# Patient Record
Sex: Male | Born: 2000 | Race: White | Marital: Single | Smoking: Never smoker
Health system: Southern US, Community
[De-identification: ages and names within clinical notes are randomized; demographics above are authoritative.]

---
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
## 2021-04-02 NOTE — Progress Notes (Signed)
[facility] [address] [place] [address] Phone: [phone] Subjective:   Bruce Donath, am serving as a scribe for Dr. Antoine Primas.  This visit occurred during the SARS-CoV-2 public health emergency.  Safety protocols were in place, including screening questions prior to the visit, additional usage of staff PPE, and extensive cleaning of exam room while observing appropriate contact time as indicated for disinfecting solutions.   I'm seeing this patient by the request  of:  No primary care provider on file.  CC: Right shoulder pain  [id number]  Tevion Laforge is a 20 y.o. male coming in with complaint of right shoulder pain. Patient states that pain began at 20 yo when he swam. Pain occurring over superior aspect. Pain with bench press and OH press. Patient modifies activity or rests. Patient like ji jitsu. Denies any radiating symptoms.       No past medical history on file. No past surgical history on file. Social History   Socioeconomic History   Marital status: Single    Spouse name: Not on file   Number of children: Not on file   Years of education: Not on file   Highest education level: Not on file  Occupational History   Not on file  Tobacco Use   Smoking status: Never   Smokeless tobacco: Never  Substance and Sexual Activity   Alcohol use: Not on file   Drug use: Not on file   Sexual activity: Not on file  Other Topics Concern   Not on file  Social History Narrative   Not on file   Social Determinants of Health   Financial Resource Strain: Not on file  Food Insecurity: Not on file  Transportation Needs: Not on file  Physical Activity: Not on file  Stress: Not on file  Social Connections: Not on file   Not on File No family history on file.     Current Outpatient Medications (Analgesics):    meloxicam (MOBIC) 7.5 MG tablet, Take 1 tablet (7.5 mg total) by mouth daily.    Reviewed prior external  information including notes and imaging from  primary care provider As well as notes that were available from care everywhere and other healthcare systems.  Past medical history, social, surgical and family history all reviewed in electronic medical record.  No pertanent information unless stated regarding to the chief complaint.   Review of Systems:  No headache, visual changes, nausea, vomiting, diarrhea, constipation, dizziness, abdominal pain, skin rash, fevers, chills, night sweats, weight loss, swollen lymph nodes, body aches, joint swelling, chest pain, shortness of breath, mood changes. POSITIVE muscle aches  Objective  Blood pressure 114/74, pulse 73, height 6' (1.829 m), weight 169 lb (76.7 kg), SpO2 97 %.   General: No apparent distress alert and oriented x3 mood and affect normal, dressed appropriately.  HEENT: Pupils equal, extraocular movements intact  Respiratory: Patient's speak in full sentences and does not appear short of breath  Cardiovascular: No lower extremity edema, non tender, no erythema  Gait normal with good balance and coordination.  MSK: Right shoulder exam shows the patient does have mild impingement noted with Hawkins.  Patient does have full range of motion otherwise noted.  Patient does have 5-5 strength of the rotator cuff.   Limited muscular skeletal ultrasound was performed and interpreted by Antoine Primas, M  Limited shoulder views show the patient does have some mild hypoechoic changes of the tendon which is consistent with mild tendinitis.  No true tearing appreciated.  No underlying arthritic changes noted. Impression: Mild tendinitis  [id number]; 15 additional minutes spent for Therapeutic exercises as stated in above notes.  This included exercises focusing on stretching, strengthening, with significant focus on eccentric aspects.   Long term goals include an improvement in range of motion, strength, endurance as well as avoiding reinjury. Patient's  frequency would include in 1-2 times a day, 3-5 times a week for a duration of 6-12 weeks. Shoulder Exercises that included:  Basic scapular stabilization to include adduction and depression of scapula Scaption, focusing on proper movement and good control Internal and External rotation utilizing a theraband, with elbow tucked at side entire time Rows with theraband    Proper technique shown and discussed handout in great detail with ATC.  All questions were discussed and answered.      Impression and Recommendations:     The above documentation has been reviewed and is accurate and complete Judi Saa, DO

## 2021-04-05 ENCOUNTER — Ambulatory Visit (INDEPENDENT_AMBULATORY_CARE_PROVIDER_SITE_OTHER): Payer: 59

## 2021-04-05 ENCOUNTER — Ambulatory Visit: Payer: Self-pay

## 2021-04-05 ENCOUNTER — Other Ambulatory Visit: Payer: Self-pay

## 2021-04-05 ENCOUNTER — Encounter: Payer: Self-pay | Admitting: Family Medicine

## 2021-04-05 ENCOUNTER — Ambulatory Visit (INDEPENDENT_AMBULATORY_CARE_PROVIDER_SITE_OTHER): Payer: 59 | Admitting: Family Medicine

## 2021-04-05 VITALS — BP 114/74 | HR 73 | Ht 72.0 in | Wt 169.0 lb

## 2021-04-05 DIAGNOSIS — M25511 Pain in right shoulder: Secondary | ICD-10-CM

## 2021-04-05 DIAGNOSIS — M7541 Impingement syndrome of right shoulder: Secondary | ICD-10-CM | POA: Diagnosis not present

## 2021-04-05 MED ORDER — MELOXICAM 7.5 MG PO TABS: 7.5000 mg | ORAL_TABLET | Freq: Every day | ORAL | 0 refills | Status: DC

## 2021-04-05 NOTE — Patient Instructions (Signed)
Scapular exercises Meloxicam 7.5mg  Keep hands in peripheral vision See me in 6 weeks

## 2021-04-05 NOTE — Assessment & Plan Note (Signed)
Patient seems to be having more shoulder impingement.  Seems to be secondary to potential posture and ergonomics.  Discussed icing regimen and home exercises.  We discussed with patient about avoiding certain positioning throughout the day that I think is also potentially contributing.  Discussed icing regimen and home exercises.  Increase activity slowly.  Follow-up with me again 6 to 8 weeks.

## 2021-05-13 NOTE — Progress Notes (Signed)
[facility] [address] [place] [address] Phone: [phone] Subjective:   Stuart Hardin, am serving as a scribe for Dr. Antoine Primas. This visit occurred during the SARS-CoV-2 public health emergency.  Safety protocols were in place, including screening questions prior to the visit, additional usage of staff PPE, and extensive cleaning of exam room while observing appropriate contact time as indicated for disinfecting solutions.   I'm seeing this patient by the request  of:  Pcp, No  CC: shoulder pain follow up   [id number]  04/05/2021 Patient seems to be having more shoulder impingement.  Seems to be secondary to potential posture and ergonomics.  Discussed icing regimen and home exercises.  We discussed with patient about avoiding certain positioning throughout the day that I think is also potentially contributing.  Discussed icing regimen and home exercises.  Increase activity slowly.  Follow-up with me again 6 to 8 weeks.  Updated 05/17/2021 Stuart Hardin is a 20 y.o. male coming in with complaint of right shoulder pain. Noticing more pain than last visit with IR, ER and flexion. No new injury. Patient making modifications in weight room. No numbness or tingling.  Patient states that unfortunately continues to have difficulty with that he does not think he is making improvement and may even notice a little bit decrease in range of motion.  Xray unremarkable     No past medical history on file. No past surgical history on file. Social History   Socioeconomic History   Marital status: Single    Spouse name: Not on file   Number of children: Not on file   Years of education: Not on file   Highest education level: Not on file  Occupational History   Not on file  Tobacco Use   Smoking status: Never   Smokeless tobacco: Never  Substance and Sexual Activity   Alcohol use: Not on file   Drug use: Not on file   Sexual activity: Not on  file  Other Topics Concern   Not on file  Social History Narrative   Not on file   Social Determinants of Health   Financial Resource Strain: Not on file  Food Insecurity: Not on file  Transportation Needs: Not on file  Physical Activity: Not on file  Stress: Not on file  Social Connections: Not on file   Not on File No family history on file.     Current Outpatient Medications (Analgesics):    meloxicam (MOBIC) 7.5 MG tablet, Take 1 tablet (7.5 mg total) by mouth daily.    Reviewed prior external information including notes and imaging from  primary care provider As well as notes that were available from care everywhere and other healthcare systems.  Past medical history, social, surgical and family history all reviewed in electronic medical record.  No pertanent information unless stated regarding to the chief complaint.   Review of Systems:  No headache, visual changes, nausea, vomiting, diarrhea, constipation, dizziness, abdominal pain, skin rash, fevers, chills, night sweats, weight loss, swollen lymph nodes, body aches, joint swelling, chest pain, shortness of breath, mood changes. POSITIVE muscle aches  Objective  Blood pressure 112/64, pulse (!) 59, height 6' (1.829 m), weight 167 lb (75.8 kg), SpO2 97 %.   General: No apparent distress alert and oriented x3 mood and affect normal, dressed appropriately.  HEENT: Pupils equal, extraocular movements intact  Respiratory: Patient's speak in full sentences and does not appear short of breath  Cardiovascular: No  lower extremity edema, non tender, no erythema  Gait normal with good balance and coordination.  MSK:  shoulder exam shows patient does have impingement noted.  Patient does have limited internal and external range of motion lacking last 5 degrees.  Positive O'Brien's noted.  Negative crossover.  Limited muscular skeletal ultrasound was performed and interpreted by Antoine Primas, M  Limited musculoskeletal  ultrasound shows the patient does have an increase in hypoechoic changes of the glenohumeral joint on the posterior aspect from previous exam.  Patient also has what appears to be a potential labral tear noted.  Difficult to assess completely.  Patient also has history of the hypoechoic changes of the supraspinatus that was seen previously no acute tear appreciated.   Impression and Recommendations:    The above documentation has been reviewed and is accurate and complete Judi Saa, DO

## 2021-05-17 ENCOUNTER — Encounter: Payer: Self-pay | Admitting: Family Medicine

## 2021-05-17 ENCOUNTER — Ambulatory Visit: Payer: Self-pay

## 2021-05-17 ENCOUNTER — Ambulatory Visit (INDEPENDENT_AMBULATORY_CARE_PROVIDER_SITE_OTHER): Payer: 59 | Admitting: Family Medicine

## 2021-05-17 ENCOUNTER — Other Ambulatory Visit: Payer: Self-pay

## 2021-05-17 VITALS — BP 112/64 | HR 59 | Ht 72.0 in | Wt 167.0 lb

## 2021-05-17 DIAGNOSIS — M25511 Pain in right shoulder: Secondary | ICD-10-CM | POA: Diagnosis not present

## 2021-05-17 DIAGNOSIS — M7541 Impingement syndrome of right shoulder: Secondary | ICD-10-CM | POA: Diagnosis not present

## 2021-05-17 NOTE — Assessment & Plan Note (Signed)
Patient continues to have impingement but now is having more of an O'Brien sign.  Patient has failed conservative therapy including home exercises, physical therapy, oral anti-inflammatories and is now having some limited range of motion.  Concerned that patient also could have a possible labral pathology so MR arthrogram ordered today.  Depending on findings we will discuss different treatment options including injections or the possibility of surgical intervention if necessary.

## 2021-05-17 NOTE — Patient Instructions (Addendum)
MRA R shoulder [phone] We will contact you with results

## 2021-06-11 ENCOUNTER — Ambulatory Visit
Admission: RE | Admit: 2021-06-11 | Discharge: 2021-06-11 | Disposition: A | Discharge status: Home / Self Care | Payer: 59 | Source: Ambulatory Visit | Attending: Family Medicine | Admitting: Family Medicine

## 2021-06-11 DIAGNOSIS — M25511 Pain in right shoulder: Secondary | ICD-10-CM

## 2021-06-11 MED ORDER — IOPAMIDOL (ISOVUE-M 200) INJECTION 41%
18.0000 mL | Freq: Once | INTRAMUSCULAR | Status: AC
  Administered 2021-06-11: 18 mL via INTRA_ARTICULAR

## 2021-06-16 NOTE — Progress Notes (Signed)
  [facility] [address] [place] [address] Phone: [phone] Subjective:   [name], am serving as a scribe for Dr. Antoine Primas. This visit occurred during the SARS-CoV-2 public health emergency.  Safety protocols were in place, including screening questions prior to the visit, additional usage of staff PPE, and extensive cleaning of exam room while observing appropriate contact time as indicated for disinfecting solutions.   I'm seeing this patient by the request  of:  Pcp, No  CC: shoulder pain   [id number]  05/17/2021 Patient continues to have impingement but now is having more of an O'Brien sign.  Patient has failed conservative therapy including home exercises, physical therapy, oral anti-inflammatories and is now having some limited range of motion.  Concerned that patient also could have a possible labral pathology so MR arthrogram ordered today.  Depending on findings we will discuss different treatment options including injections or the possibility of surgical intervention if necessary.  Updated 06/17/2021 Stuart Hardin is a 20 y.o. male coming in with complaint of shoulder pain. Pain remains the same. May have gotten a little better. PRP possible.  Mri IMPRESSION: 1. 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 2. No discrete labral tear. Tiny tear at the posterior labroligamentous junction.       No past medical history on file. No past surgical history on file. Social History   Socioeconomic History   Marital status: Single    Spouse name: Not on file   Number of children: Not on file   Years of education: Not on file   Highest education level: Not on file  Occupational History   Not on file  Tobacco Use   Smoking status: Never   Smokeless tobacco: Never  Substance and Sexual Activity   Alcohol use: Not on file   Drug use: Not on file   Sexual activity: Not on file  Other Topics  Concern   Not on file  Social History Narrative   Not on file   Social Determinants of Health   Financial Resource Strain: Not on file  Food Insecurity: Not on file  Transportation Needs: Not on file  Physical Activity: Not on file  Stress: Not on file  Social Connections: Not on file   Not on File No family history on file.     Current Outpatient Medications (Analgesics):    meloxicam (MOBIC) 7.5 MG tablet, Take 1 tablet (7.5 mg total) by mouth daily.     Review of Systems:  No headache, visual changes, nausea, vomiting, diarrhea, constipation, dizziness, abdominal pain, skin rash, fevers, chills, night sweats, weight loss, swollen lymph nodes, body aches, joint swelling, chest pain, shortness of breath, mood changes. POSITIVE muscle aches  Objective  Blood pressure 118/64, height 6' (1.829 m), weight 170 lb (77.1 kg).   General: No apparent distress alert and oriented x3 mood and affect normal, dressed appropriately.  HEENT: Pupils equal, extraocular movements intact  Respiratory: Patient's speak in full sentences and does not appear short of breath  Cardiovascular: No lower extremity edema, non tender, no erythema  Gait normal with good balance and coordination.  MSK:  right shoulder mild impingement mild positive O'Brien's.  5-5 strength of the rotator cuff noted    Impression and Recommendations:    The above documentation has been reviewed and is accurate and complete Judi Saa, DO

## 2021-06-17 ENCOUNTER — Other Ambulatory Visit: Payer: Self-pay

## 2021-06-17 ENCOUNTER — Ambulatory Visit (INDEPENDENT_AMBULATORY_CARE_PROVIDER_SITE_OTHER): Payer: 59 | Admitting: Family Medicine

## 2021-06-17 VITALS — BP 118/64 | Ht 72.0 in | Wt 170.0 lb

## 2021-06-17 DIAGNOSIS — M7541 Impingement syndrome of right shoulder: Secondary | ICD-10-CM | POA: Diagnosis not present

## 2021-06-17 DIAGNOSIS — S46811A Strain of other muscles, fascia and tendons at shoulder and upper arm level, right arm, initial encounter: Secondary | ICD-10-CM | POA: Diagnosis not present

## 2021-06-17 NOTE — Assessment & Plan Note (Addendum)
Patient does have a small interstitial tearing of the infraspinatus noted.  No significant weakness at this time.  He does have some very mild labral pathology as well on the MRI.  Patient is making progress and states that does not have as much significant discomfort as he did previously.  Do feel that formal physical therapy could be beneficial.  Depending on how patient responds we will see patient again in 6 to 8 weeks to discuss further.  Worsening symptoms could consider the possibility of injection.  Total time to speak discussed with patient as well as reviewing previous notes, previous ultrasounds and patient's MRI 31 minutes

## 2021-06-17 NOTE — Patient Instructions (Addendum)
PT referral  You're going to do great Keep being aware of positioning See you again in 2 months

## 2021-07-01 ENCOUNTER — Ambulatory Visit
Admission: EM | Admit: 2021-07-01 | Discharge: 2021-07-01 | Disposition: A | Discharge status: Home / Self Care | Payer: 59 | Attending: Nurse Practitioner | Admitting: Nurse Practitioner

## 2021-07-01 ENCOUNTER — Other Ambulatory Visit: Payer: Self-pay

## 2021-07-01 DIAGNOSIS — R197 Diarrhea, unspecified: Secondary | ICD-10-CM | POA: Diagnosis not present

## 2021-07-01 DIAGNOSIS — R5383 Other fatigue: Secondary | ICD-10-CM

## 2021-07-01 DIAGNOSIS — E860 Dehydration: Secondary | ICD-10-CM

## 2021-07-01 LAB — POCT URINALYSIS DIP (MANUAL ENTRY)
Bilirubin, UA: NEGATIVE
Blood, UA: NEGATIVE
Glucose, UA: NEGATIVE mg/dL
Leukocytes, UA: NEGATIVE
Nitrite, UA: NEGATIVE
Protein Ur, POC: NEGATIVE mg/dL
Spec Grav, UA: <=1.005 — AB (ref 1.010–1.025)
Urobilinogen, UA: 0.2 E.U./dL
pH, UA: 5.5 (ref 5.0–8.0)

## 2021-07-01 NOTE — ED Triage Notes (Signed)
Patient states he has been feeling dehydrated and has had a lack of appetite (feeling nauseated). Pt states this morning he felt lethargic. He reports the dehydration began when he stated taking a creatine supplement (x7 days ago).

## 2021-07-01 NOTE — Discharge Instructions (Addendum)
You are mildly dehydrated  Stop taking the creatine supplements.  Drink plenty of fluids to keep your urine clear/pale yellow  Do not drink only water. Drink diluted fruit juice and low-calorie sports drinks Eat foods that have the right amounts of salts and minerals, such as bananas, oranges, potatoes, tomatoes, spinach, etc.  Do not drink alcohol. Avoid drinks that have a lot of sugar like high-calorie sports drinks, undiluted fruit juices, sodas and caffeine    Go the the nearest emergency department if:  You cannot eat or drink without vomiting. You develop a fever. You get a bad headache Your watery poop gets worse or does not go away. You don't pee for 6-8 hours. You pee only a small amount of very dark pee in 6-8 hours.

## 2021-07-01 NOTE — ED Provider Notes (Signed)
[facility]    CSN: [id number] Arrival date & time: 07/01/21  1519      History   Chief Complaint No chief complaint on file.   HPI Stuart Hardin is a 20 y.o. male.   Subjective:   Stuart Hardin is a 20 y.o. male who presents for evaluation for possible dehydration.  Patient reports that he started taking creatinine supplements 1 week ago to assist him with his workouts.  Over the past day or so, he has had diarrhea, fatigue and decreased appetite. He also reports very mild nausea. He is drinking lots of fluids but is continuously thirsty. His urine is clear. Patient describes diarrhea as watery. Patient denies chills, URI symptoms, vomiting, headache, dizziness, chest pain, palpitations, shortness of breath, blood in stool, fever, illness in household contacts, recent antibiotic use, recent travel, abdominal pain, myalgias, arthralgias  or unintentional weight loss.   The following portions of the patient's history were reviewed and updated as appropriate: allergies, current medications, past family history, past medical history, past social history, past surgical history, and problem list.       History reviewed. No pertinent past medical history.  Patient Active Problem List   Diagnosis Date Noted   Infraspinatus tendon tear, right, initial encounter 06/17/2021   Impingement syndrome of right shoulder 04/05/2021    History reviewed. No pertinent surgical history.     Home Medications    Prior to Admission medications   Not on File    Family History History reviewed. No pertinent family history.  Social History Social History   Tobacco Use   Smoking status: Never   Smokeless tobacco: Never  Substance Use Topics   Alcohol use: Yes    Comment: occasionally   Drug use: Yes    Types: Marijuana     Allergies   Patient has no allergy information on record.   Review of Systems Review of Systems  Constitutional:  Positive for appetite change and  fatigue. Negative for chills, diaphoresis, fever and unexpected weight change.  Cardiovascular:  Negative for chest pain and palpitations.  Gastrointestinal:  Positive for diarrhea and nausea. Negative for abdominal pain, blood in stool and vomiting.  Genitourinary:  Negative for difficulty urinating and dysuria.  Musculoskeletal:  Negative for arthralgias and myalgias.  Skin:  Negative for color change.  Neurological:  Negative for dizziness and headaches.  All other systems reviewed and are negative.   Physical Exam Triage Vital Signs ED Triage Vitals  Enc Vitals Group     BP 07/01/21 1538 118/72     Pulse Rate 07/01/21 1538 74     Resp 07/01/21 1538 16     Temp 07/01/21 1538 98.9 F (37.2 C)     Temp Source 07/01/21 1538 Oral     SpO2 07/01/21 1538 98 %     Weight --      Height --      Head Circumference --      Peak Flow --      Pain Score 07/01/21 1537 0     Pain Loc --      Pain Edu? --      Excl. in GC? --    No data found.  Updated Vital Signs BP 118/72 (BP Location: Left Arm)    Pulse 74    Temp 98.9 F (37.2 C) (Oral)    Resp 16    SpO2 98%   Visual Acuity Right Eye Distance:   Left Eye Distance:   Bilateral  Distance:    Right Eye Near:   Left Eye Near:    Bilateral Near:     Physical Exam Vitals reviewed.  Constitutional:      General: He is not in acute distress.    Appearance: Normal appearance. He is normal weight. He is not ill-appearing, toxic-appearing or diaphoretic.  HENT:     Head: Normocephalic.     Nose: Nose normal.     Mouth/Throat:     Mouth: Mucous membranes are moist.  Eyes:     Conjunctiva/sclera: Conjunctivae normal.  Cardiovascular:     Rate and Rhythm: Normal rate and regular rhythm.  Pulmonary:     Effort: Pulmonary effort is normal.     Breath sounds: Normal breath sounds.  Abdominal:     General: Bowel sounds are normal.     Palpations: Abdomen is soft.  Musculoskeletal:        General: Normal range of motion.      Cervical back: Normal range of motion and neck supple.  Lymphadenopathy:     Cervical: No cervical adenopathy.  Skin:    General: Skin is warm and dry.  Neurological:     General: No focal deficit present.     Mental Status: He is alert and oriented to person, place, and time.     UC Treatments / Results  Labs (all labs ordered are listed, but only abnormal results are displayed) Labs Reviewed  POCT URINALYSIS DIP (MANUAL ENTRY) - Abnormal; Notable for the following components:      Result Value   Ketones, POC UA trace (5) (*)    Spec Grav, UA <=1.005 (*)    All other components within normal limits  COMPREHENSIVE METABOLIC PANEL    EKG   Radiology No results found.  Procedures Procedures (including critical care time)  Medications Ordered in UC Medications - No data to display  Initial Impression / Assessment and Plan / UC Course  I have reviewed the triage vital signs and the nursing notes.  Pertinent labs & imaging results that were available during my care of the patient were reviewed by me and considered in my medical decision making (see chart for details).    20 y.o. male who presents with watery diarrhea, fatigue, nausea and decreased appetite for the past day or so after starting creatinine supplements about a week ago. He is drinking lots of fluids but is continuously thirsty.  Patient is afebrile.  Vital signs stable.  Physical exam unremarkable.  Trace ketones noted in urinalysis suggestive of mild dehydration.  CMP pending.  Plan:  Stop creatinine supplements Drink plenty of fluids to keep urine clear/pale yellow Antiemetics as needed Diet as tolerated Discussed indications for immediate ED evaluation Further recommendations pending results of comprehensive metabolic panel Follow-up with PCP  Today's evaluation has revealed no signs of a dangerous process. Discussed diagnosis with patient and/or guardian. Patient and/or guardian aware of their  diagnosis, possible red flag symptoms to watch out for and need for close follow up. Patient and/or guardian understands verbal and written discharge instructions. Patient and/or guardian comfortable with plan and disposition.  Patient and/or guardian has a clear mental status at this time, good insight into illness (after discussion and teaching) and has clear judgment to make decisions regarding their care  This care was provided during an unprecedented National Emergency due to the Novel Coronavirus (COVID-19) pandemic. COVID-19 infections and transmission risks place heavy strains on healthcare resources.  As this pandemic evolves, our facility, providers,  and staff strive to respond fluidly, to remain operational, and to provide care relative to available resources and information. Outcomes are unpredictable and treatments are without well-defined guidelines. Further, the impact of COVID-19 on all aspects of urgent care, including the impact to patients seeking care for reasons other than COVID-19, is unavoidable during this national emergency. At this time of the global pandemic, management of patients has significantly changed, even for non-COVID positive patients given high local and regional COVID volumes at this time requiring high healthcare system and resource utilization. The standard of care for management of both COVID suspected and non-COVID suspected patients continues to change rapidly at the local, regional, national, and global levels. This patient was worked up and treated to the best available but ever changing evidence and resources available at this current time.   Documentation was completed with the aid of voice recognition software. Transcription may contain typographical errors. Final Clinical Impressions(s) / UC Diagnoses   Final diagnoses:  Dehydration  Diarrhea, unspecified type  Other fatigue     Discharge Instructions      You are mildly dehydrated  Stop taking the  creatine supplements.  Drink plenty of fluids to keep your urine clear/pale yellow  Do not drink only water. Drink diluted fruit juice and low-calorie sports drinks Eat foods that have the right amounts of salts and minerals, such as bananas, oranges, potatoes, tomatoes, spinach, etc.  Do not drink alcohol. Avoid drinks that have a lot of sugar like high-calorie sports drinks, undiluted fruit juices, sodas and caffeine    Go the the nearest emergency department if:  You cannot eat or drink without vomiting. You develop a fever. You get a bad headache Your watery poop gets worse or does not go away. You don't pee for 6-8 hours. You pee only a small amount of very dark pee in 6-8 hours.     ED Prescriptions   None    PDMP not reviewed this encounter.   Lurline Idol, [place] 07/01/21 [phone]

## 2021-07-02 LAB — COMPREHENSIVE METABOLIC PANEL
ALT: 33 IU/L (ref 0–44)
AST: 33 IU/L (ref 0–40)
Albumin/Globulin Ratio: 1.8 (ref 1.2–2.2)
Albumin: 5.2 g/dL (ref 4.1–5.2)
Alkaline Phosphatase: 135 IU/L — ABNORMAL HIGH (ref 51–125)
BUN/Creatinine Ratio: 12 (ref 9–20)
BUN: 11 mg/dL (ref 6–20)
Bilirubin Total: 0.6 mg/dL (ref 0.0–1.2)
CO2: 19 mmol/L — ABNORMAL LOW (ref 20–29)
Calcium: 9.7 mg/dL (ref 8.7–10.2)
Chloride: 97 mmol/L (ref 96–106)
Creatinine, Ser: 0.95 mg/dL (ref 0.76–1.27)
Globulin, Total: 2.9 g/dL (ref 1.5–4.5)
Glucose: 88 mg/dL (ref 70–99)
Potassium: 4.5 mmol/L (ref 3.5–5.2)
Sodium: 133 mmol/L — ABNORMAL LOW (ref 134–144)
Total Protein: 8.1 g/dL (ref 6.0–8.5)
eGFR: 118 mL/min/{1.73_m2} (ref >59)

## 2021-07-14 ENCOUNTER — Other Ambulatory Visit: Payer: Self-pay

## 2021-07-14 ENCOUNTER — Encounter: Payer: Self-pay | Admitting: Physical Therapy

## 2021-07-14 ENCOUNTER — Ambulatory Visit (INDEPENDENT_AMBULATORY_CARE_PROVIDER_SITE_OTHER): Payer: 59 | Admitting: Physical Therapy

## 2021-07-14 DIAGNOSIS — G8929 Other chronic pain: Secondary | ICD-10-CM

## 2021-07-14 DIAGNOSIS — M6281 Muscle weakness (generalized): Secondary | ICD-10-CM | POA: Diagnosis not present

## 2021-07-14 DIAGNOSIS — M25511 Pain in right shoulder: Secondary | ICD-10-CM

## 2021-07-14 NOTE — Therapy (Signed)
OUTPATIENT PHYSICAL THERAPY SHOULDER EVALUATION   Patient Name: Stuart Hardin MRN: [id number] DOB:[birth year], 21 y.o., male Today's Date: 07/14/2021   PT End of Session - 07/14/21 0904     Visit Number 1    Number of Visits 12    Date for PT Re-Evaluation 08/25/21    Authorization Type [employer]    PT Start Time 0815    PT Stop Time 0850    PT Time Calculation (min) 35 min    Activity Tolerance Patient tolerated treatment well    Behavior During Therapy [facility] for tasks assessed/performed             History reviewed. No pertinent past medical history. History reviewed. No pertinent surgical history. Patient Active Problem List   Diagnosis Date Noted   Infraspinatus tendon tear, right, initial encounter 06/17/2021   Impingement syndrome of right shoulder 04/05/2021    PCP: Pcp, No  REFERRING PROVIDER: Lyndal Pulley, DO  REFERRING DIAG: R shoulder pain  THERAPY DIAG:  Chronic right shoulder pain  Muscle weakness (generalized)    SUBJECTIVE:                                                                                                                                                                                      SUBJECTIVE STATEMENT: Pt states ongoing R shoulder pain for years. 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 Notes increased pain and quite a bit of instability with lifting, as well as raising arm overhead or holding arm out in front of him. He has had Recent MRI with +findings for labral and infraspinatus pathology.    PERTINENT HISTORY: none  PAIN:  Are you having pain? Yes VAS scale: 4/10 Pain location: shoulder  Pain orientation: Right  PAIN TYPE: aching Pain description: intermittent  Aggravating factors: elevation, reaching up/out, lifting, press, overhead motions.  Relieving factors: rest   PRECAUTIONS: None  WEIGHT BEARING RESTRICTIONS  No  FALLS Has patient fallen in last 6 months? No Number of falls: 0  OCCUPATION:   PLOF: Independent  PATIENT GOALS Decreased pain   OBJECTIVE:   DIAGNOSTIC FINDINGS:  Recent MRI:    IMPRESSION: 1. 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 2. No discrete labral tear. Tiny tear at the posterior labroligamentous junction.    COGNITION:  Overall cognitive status: Within functional limits for tasks assessed      POSTURE: Mild rounding of shoulders/ flexible.   PALPATION: No pain to palpate R shoulder   UPPER EXTREMITY AROM/PROM:   AROM: shoulders:  WNL bil   UPPER EXTREMITY MMT:   R shoulder: flex, abd: 4+/5,  IR: 5/5,  ER: 4/5  Noted weakness fatigue and instability with repeated motions for ER and elevation on R.                                                              Grip strength (lbs)    (Blank rows = not tested)  JOINT MOBILITY TESTING:  Hypermobile GHJ bil, R>L with noted instability     TODAY'S TREATMENT:  07/14/21:   Therapeutic Exercise:  Aerobic: Supine:  Seated:  Standing: AROM slow ecc lowering for shoulder scaption 2 lb x 15;  IR Blue TB x20, ER GTB x 2x10; Rows Black TB x 20;  S/L: R shoulder ER, 2lb x 15;  Neuromuscular Re-education: Manual Therapy: Therapeutic Activity: Self Care: Trigger Point Dry Needling:      PATIENT EDUCATION: Education details: PT POC, exam findings, HEP, activities to avoid and modifications for gym/lifting  Person educated: Patient Education method: Explanation, Demonstration, Tactile cues, Verbal cues, and Handouts Education comprehension: verbalized understanding, returned demonstration, verbal cues required, tactile cues required, and needs further education   HOME EXERCISE PROGRAM: Access Code: [email or web address] URL: [email or web address] Date: 07/14/2021 Prepared by: Lyndee Hensen  Exercises Shoulder Internal Rotation with Resistance - 1 x  daily - 2 sets - 10 reps Shoulder External Rotation with Anchored Resistance - 1 x daily - 2 sets - 10 reps Sidelying Shoulder ER with Towel and Dumbbell - 1 x daily - 2 sets - 10 reps Standing Row with Anchored Resistance - 1 x daily - 2 sets - 10 reps Standing Shoulder Scaption - 1 x daily - 2 sets - 10 reps    ASSESSMENT:  CLINICAL IMPRESSION:  07/14/21:  Pt presents with primary compliant of increased pain in R shoulder. He has weakness for ER vs IR, and noted instability. He has weakness and instability noted with elevation and ability for functional activities with elevation and motions away from body. Pt with decreased ability for full functional activities, IADLS, community activities and exercise, due to pain and deficits. He will benefit from skilled PT to improve deficits and pain. He will also benefit from education on HEP for his diagnosis.    Objective impairments include decreased activity tolerance, decreased mobility, decreased strength, increased muscle spasms, impaired flexibility, and pain. These impairments are limiting patient from cleaning, community activity, meal prep, occupation, laundry, yard work, and shopping. NOPersonal factors affecting patient's functional outcome. Patient will benefit from skilled PT to address above impairments and improve overall function.  REHAB POTENTIAL: Good  CLINICAL DECISION MAKING: Stable/uncomplicated  EVALUATION COMPLEXITY: Low   GOALS: Goals reviewed with patient? Yes SHORT TERM GOALS:  STG Name Target Date Goal status  1 Patient will be independent with initial HEP   07/28/21 INITIAL  2            LONG TERM GOALS:   LTG Name Target Date Goal status  1 Patient will be independent with final HEP  08/25/21 INITIAL  2 Pt to report decreased pain in shoulder to 0-2/10 with reaching, lifting, carrying to improve ability for IADLs.   08/25/21 INITIAL  3 Pt to demonstrate improved strength of shoulder to be 5/5, for all  motions, with noted  improvement in GHJ stability, to improve ability for lifting, press, carrying and IADLS.   08/25/21 INITIAL  4      5        PLAN: PT FREQUENCY: 1-2x/week  PT DURATION: 6 weeks  PLANNED INTERVENTIONS: Therapeutic exercises, Therapeutic activity, Neuro Muscular re-education, Patient/Family education, Joint mobilization, Dry Needling, Cryotherapy, Moist heat, Taping, Vasopneumatic device, Ultrasound, Ionotophoresis 4mg /ml Dexamethasone, and Manual therapy  Lyndee Hensen, PT, DPT 9:28 AM  07/14/21

## 2021-07-22 ENCOUNTER — Other Ambulatory Visit: Payer: Self-pay

## 2021-07-22 ENCOUNTER — Ambulatory Visit (INDEPENDENT_AMBULATORY_CARE_PROVIDER_SITE_OTHER): Payer: 59 | Admitting: Physical Therapy

## 2021-07-22 ENCOUNTER — Encounter: Payer: Self-pay | Admitting: Physical Therapy

## 2021-07-22 DIAGNOSIS — M25511 Pain in right shoulder: Secondary | ICD-10-CM | POA: Diagnosis not present

## 2021-07-22 DIAGNOSIS — G8929 Other chronic pain: Secondary | ICD-10-CM | POA: Diagnosis not present

## 2021-07-22 DIAGNOSIS — M6281 Muscle weakness (generalized): Secondary | ICD-10-CM

## 2021-07-22 NOTE — Therapy (Signed)
OUTPATIENT PHYSICAL THERAPY SHOULDER EVALUATION   Patient Name: Stuart Hardin MRN: [id number] DOB:17-Sep-2000, 21 y.o., male Today's Date: 07/22/2021   PT End of Session - 07/22/21 1013     Visit Number 2    Number of Visits 12    Date for PT Re-Evaluation 08/25/21    Authorization Type UHC    PT Start Time 0932    PT Stop Time 1010    PT Time Calculation (min) 38 min    Activity Tolerance Patient tolerated treatment well    Behavior During Therapy [facility] for tasks assessed/performed              History reviewed. No pertinent past medical history. History reviewed. No pertinent surgical history. Patient Active Problem List   Diagnosis Date Noted   Infraspinatus tendon tear, right, initial encounter 06/17/2021   Impingement syndrome of right shoulder 04/05/2021    PCP: Pcp, No  REFERRING PROVIDER: Terrilee Files  REFERRING DIAG: R shoulder pain  THERAPY DIAG:  Chronic right shoulder pain  Muscle weakness (generalized)    SUBJECTIVE:                                                                                                                                                                                      SUBJECTIVE STATEMENT: Pt reports minimal pain, has been doing HEP. He does feel continued instability and clicking with reaching motions, but is slightly less.   PERTINENT HISTORY: none  PAIN:  Are you having pain? No VAS scale: 0/10 Pain location: shoulder  Pain orientation: Right  PAIN TYPE: aching Pain description: intermittent  Aggravating factors: instability feeling with elevation, reaching up/out, lifting, press, overhead motions.  Relieving factors: rest   PRECAUTIONS: None   PATIENT GOALS Decreased pain   OBJECTIVE:   DIAGNOSTIC FINDINGS:  Recent MRI:    IMPRESSION: 1. 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 2. No discrete labral tear. Tiny tear at the posterior labroligamentous junction.     COGNITION:  Overall cognitive status: Within functional limits for tasks assessed      POSTURE: Mild rounding of shoulders/ flexible.   PALPATION: No pain to palpate R shoulder   UPPER EXTREMITY AROM/PROM:   AROM: shoulders: WNL bil   UPPER EXTREMITY MMT:   R shoulder: flex, abd: 4+/5,  IR: 5/5,  ER: 4/5  Noted weakness fatigue and instability with repeated motions for ER and elevation on R.    JOINT MOBILITY TESTING:  Hypermobile GHJ bil, R>L with noted instability     TODAY'S TREATMENT:   07/22/21:   Therapeutic Exercise:  Aerobic: Supine: ER RTB at 60 deg x 25;  Manually resisted IR/ER  and d2 flex/ext x 15 each;   Standing: AROM slow ecc lowering for shoulder scaption 2 lb x 2x10;  IR Blue TB 2x10; ER BlueTB x 2x10; Rows Black TB x 20; Wall push ups x 20;  S/L: R shoulder ER, 3lb 2x10;  Stretches: doorway 30 sec x 3 at 90 deg;   Neuromuscular Re-education:  Manual Therapy: Therapeutic Activity: Self Care: Trigger Point Dry Needling:     07/14/21:   Therapeutic Exercise:  Aerobic: Supine:  Seated:  Standing: AROM slow ecc lowering for shoulder scaption 2 lb x 15;  IR Blue TB x20, ER GTB x 2x10; Rows Black TB x 20;  S/L: R shoulder ER, 2lb x 15;  Neuromuscular Re-education: Manual Therapy: Therapeutic Activity: Self Care: Trigger Point Dry Needling:      PATIENT EDUCATION: Education details: reviewed and updated HEP Person educated: Patient Education method: Programmer, multimedia, Demonstration, Tactile cues, Verbal cues, and Handouts Education comprehension: verbalized understanding, returned demonstration, verbal cues required, tactile cues required, and needs further education   HOME EXERCISE PROGRAM: Access Code: [email or web address]    ASSESSMENT:  CLINICAL IMPRESSION: 07/22/21 : Pt with good ability for strength progressions today, noted instability and fatigue with activities, but no increased pain today. Pt to benefit from continued strength and stability.     Objective impairments include decreased activity tolerance, decreased mobility, decreased strength, increased muscle spasms, impaired flexibility, and pain. These impairments are limiting patient from cleaning, community activity, meal prep, occupation, laundry, yard work, and shopping. NOPersonal factors affecting patient's functional outcome. Patient will benefit from skilled PT to address above impairments and improve overall function.  REHAB POTENTIAL: Good  CLINICAL DECISION MAKING: Stable/uncomplicated  EVALUATION COMPLEXITY: Low   GOALS: Goals reviewed with patient? Yes SHORT TERM GOALS:  STG Name Target Date Goal status  1 Patient will be independent with initial HEP   07/28/21 INITIAL  2            LONG TERM GOALS:   LTG Name Target Date Goal status  1 Patient will be independent with final HEP  08/25/21 INITIAL  2 Pt to report decreased pain in shoulder to 0-2/10 with reaching, lifting, carrying to improve ability for IADLs.   08/25/21 INITIAL  3 Pt to demonstrate improved strength of shoulder to be 5/5, for all motions, with noted improvement in GHJ stability, to improve ability for lifting, press, carrying and IADLS.   08/25/21 INITIAL  4      5        PLAN: PT FREQUENCY: 1-2x/week  PT DURATION: 6 weeks  PLANNED INTERVENTIONS: Therapeutic exercises, Therapeutic activity, Neuro Muscular re-education, Patient/Family education, Joint mobilization, Dry Needling, Cryotherapy, Moist heat, Taping, Vasopneumatic device, Ultrasound, Ionotophoresis 4mg /ml Dexamethasone, and Manual therapy  , PT, DPT 10:14 AM  07/22/21

## 2021-07-29 ENCOUNTER — Other Ambulatory Visit: Payer: Self-pay

## 2021-07-29 ENCOUNTER — Encounter: Payer: Self-pay | Admitting: Physical Therapy

## 2021-07-29 ENCOUNTER — Ambulatory Visit (INDEPENDENT_AMBULATORY_CARE_PROVIDER_SITE_OTHER): Payer: 59 | Admitting: Physical Therapy

## 2021-07-29 DIAGNOSIS — G8929 Other chronic pain: Secondary | ICD-10-CM | POA: Diagnosis not present

## 2021-07-29 DIAGNOSIS — M6281 Muscle weakness (generalized): Secondary | ICD-10-CM

## 2021-07-29 DIAGNOSIS — M25511 Pain in right shoulder: Secondary | ICD-10-CM | POA: Diagnosis not present

## 2021-07-29 NOTE — Therapy (Signed)
OUTPATIENT PHYSICAL THERAPY SHOULDER EVALUATION   Patient Name: Stuart Hardin MRN: [id number] DOB:2000-11-14, 21 y.o., male Today's Date: 07/29/2021   PT End of Session - 07/29/21 0841     Visit Number 3    Number of Visits 12    Date for PT Re-Evaluation 08/25/21    Authorization Type [employer]    PT Start Time 0820    PT Stop Time 0900    PT Time Calculation (min) 40 min    Activity Tolerance Patient tolerated treatment well    Behavior During Therapy [facility] for tasks assessed/performed               History reviewed. No pertinent past medical history. History reviewed. No pertinent surgical history. Patient Active Problem List   Diagnosis Date Noted   Infraspinatus tendon tear, right, initial encounter 06/17/2021   Impingement syndrome of right shoulder 04/05/2021    PCP: Pcp, No  REFERRING PROVIDER: Charlann Boxer  REFERRING DIAG: R shoulder pain  THERAPY DIAG:  Chronic right shoulder pain  Muscle weakness (generalized)    SUBJECTIVE:                                                                                                                                                                                      SUBJECTIVE STATEMENT: Pt reports minimal pain, has been doing HEP. He does feel continued instability and clicking with reaching motions, but is slightly less.   PERTINENT HISTORY: none  PAIN:  Are you having pain? No VAS scale: 0/10 Pain location: shoulder  Pain orientation: Right  PAIN TYPE: aching Pain description: intermittent  Aggravating factors: instability feeling with elevation, reaching up/out, lifting, press, overhead motions.  Relieving factors: rest   PRECAUTIONS: None   PATIENT GOALS Decreased pain   OBJECTIVE:   DIAGNOSTIC FINDINGS:  Recent MRI:    IMPRESSION: 1. 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 2. No discrete labral tear. Tiny tear at the posterior labroligamentous junction.     COGNITION:  Overall cognitive status: Within functional limits for tasks assessed      POSTURE: Mild rounding of shoulders/ flexible.   PALPATION: No pain to palpate R shoulder   UPPER EXTREMITY AROM/PROM:   AROM: shoulders: WNL bil   UPPER EXTREMITY MMT:   R shoulder: flex, abd: 4+/5,  IR: 5/5,  ER: 4/5  Noted weakness fatigue and instability with repeated motions for ER and elevation on R.    JOINT MOBILITY TESTING:  Hypermobile GHJ bil, R>L with noted instability     TODAY'S TREATMENT:    07/29/21: Therapeutic Exercise: Aerobic: UBE 4 min, 2 min fwd/bwd  Supine: ER  and IR  GTB at 60 deg x 25;   Standing: AROM slow ecc lowering for shoulder scaption 2 lb x 2x10;  IR Blue TB 2x10;   ER BlueTB x 2x10  x 10 with abd press;   Rows Black TB x 20;   Wall push ups x 20; Horiz abd GTB x 20;   S/L: R shoulder ER, 3lb 2x10;  Stretches: doorway 30 sec x 3 at 90 deg;   Neuromuscular Re-education:  Manual Therapy: Therapeutic Activity: Self Care: Trigger Point Dry Needling:      07/22/21:   Therapeutic Exercise:  Aerobic: Supine: ER RTB at 60 deg x 25;  Manually resisted IR/ER  and d2 flex/ext x 15 each;   Standing: AROM slow ecc lowering for shoulder scaption 2 lb x 2x10;  IR Blue TB 2x10; ER BlueTB x 2x10; Rows Black TB x 20; Wall push ups x 20;  S/L: R shoulder ER, 3lb 2x10;  Stretches: doorway 30 sec x 3 at 90 deg;   Neuromuscular Re-education:  Manual Therapy: Therapeutic Activity: Self Care: Trigger Point Dry Needling:     07/14/21:   Therapeutic Exercise:  Aerobic: Supine:  Seated:  Standing: AROM slow ecc lowering for shoulder scaption 2 lb x 15;  IR Blue TB x20, ER GTB x 2x10; Rows Black TB x 20;  S/L: R shoulder ER, 2lb x 15;  Neuromuscular Re-education: Manual Therapy: Therapeutic Activity: Self Care: Trigger Point Dry Needling:      PATIENT EDUCATION: Education details: reviewed and updated HEP Person educated: Patient Education method:  Explanation, Demonstration, Tactile cues, Verbal cues, and Handouts Education comprehension: verbalized understanding, returned demonstration, verbal cues required, tactile cues required, and needs further education   HOME EXERCISE PROGRAM: Access Code: [email or web address]    ASSESSMENT:  CLINICAL IMPRESSION: 07/29/21:   Pt with decreasing pain levels, but reports of continued instability feeling with exercise. He is doing well with strength progressions, will benefit from continued focus on strength and stability, with progression to more weight bearing as able.    Objective impairments include decreased activity tolerance, decreased mobility, decreased strength, increased muscle spasms, impaired flexibility, and pain. These impairments are limiting patient from cleaning, community activity, meal prep, occupation, laundry, yard work, and shopping. NOPersonal factors affecting patient's functional outcome. Patient will benefit from skilled PT to address above impairments and improve overall function.  REHAB POTENTIAL: Good  CLINICAL DECISION MAKING: Stable/uncomplicated  EVALUATION COMPLEXITY: Low   GOALS: Goals reviewed with patient? Yes SHORT TERM GOALS:  STG Name Target Date Goal status  1 Patient will be independent with initial HEP   07/28/21 MET  2            LONG TERM GOALS:   LTG Name Target Date Goal status  1 Patient will be independent with final HEP  08/25/21 INITIAL  2 Pt to report decreased pain in shoulder to 0-2/10 with reaching, lifting, carrying to improve ability for IADLs.   08/25/21 INITIAL  3 Pt to demonstrate improved strength of shoulder to be 5/5, for all motions, with noted improvement in GHJ stability, to improve ability for lifting, press, carrying and IADLS.   08/25/21 INITIAL  4      5        PLAN: PT FREQUENCY: 1-2x/week  PT DURATION: 6 weeks  PLANNED INTERVENTIONS: Therapeutic exercises, Therapeutic activity, Neuro Muscular re-education,  Patient/Family education, Joint mobilization, Dry Needling, Cryotherapy, Moist heat, Taping, Vasopneumatic device, Ultrasound, Ionotophoresis 11m/ml Dexamethasone, and Manual therapy  LLyndee Hensen  PT, DPT 9:03 AM  07/29/21

## 2021-08-05 ENCOUNTER — Encounter: Payer: 59 | Admitting: Physical Therapy

## 2021-08-12 ENCOUNTER — Encounter: Payer: 59 | Admitting: Physical Therapy

## 2021-08-19 NOTE — Progress Notes (Signed)
°  [facility] [address] [place] [address] Phone: [phone] Subjective:   [name], am serving as a scribe for Dr. Antoine Hardin. This visit occurred during the SARS-CoV-2 public health emergency.  Safety protocols were in place, including screening questions prior to the visit, additional usage of staff PPE, and extensive cleaning of exam room while observing appropriate contact time as indicated for disinfecting solutions.   I'm seeing this patient by the request  of:  Pcp, No  CC: Right shoulder follow-up  HAL:[id number]  06/17/2021 Patient does have a small interstitial tearing of the infraspinatus noted.  No significant weakness at this time.  He does have some very mild labral pathology as well on the MRI.  Patient is making progress and states that does not have as much significant discomfort as he did previously.  Do feel that formal physical therapy could be beneficial.  Depending on how patient responds we will see patient again in 6 to 8 weeks to discuss further.  Worsening symptoms could consider the possibility of injection.  Total time to speak discussed with patient as well as reviewing previous notes, previous ultrasounds and patient's MRI 31 minutes  Updated 08/20/2021 Stuart Hardin is a 21 y.o. male coming in with complaint of shoulder pain. Shoulder has been getting better. Noticed more stability. Feels the clicking sometimes, but doesn't stop exercise. No new complaints.       No past medical history on file. No past surgical history on file. Social History   Socioeconomic History   Marital status: Single    Spouse name: Not on file   Number of children: Not on file   Years of education: Not on file   Highest education level: Not on file  Occupational History   Not on file  Tobacco Use   Smoking status: Never   Smokeless tobacco: Never  Substance and Sexual Activity   Alcohol use: Yes    Comment: occasionally    Drug use: Yes    Types: Marijuana   Sexual activity: Not on file  Other Topics Concern   Not on file  Social History Narrative   Not on file   Social Determinants of Health   Financial Resource Strain: Not on file  Food Insecurity: Not on file  Transportation Needs: Not on file  Physical Activity: Not on file  Stress: Not on file  Social Connections: Not on file   Not on File No family history on file. No current outpatient medications on file.   Objective  Blood pressure 122/72, pulse 73, height 6' (1.829 m), weight 175 lb (79.4 kg), SpO2 97 %.   General: No apparent distress alert and oriented x3 mood and affect normal, dressed appropriately.  HEENT: Pupils equal, extraocular movements intact  Respiratory: Patient's speak in full sentences and does not appear short of breath  Cardiovascular: No lower extremity edema, non tender, no erythema  Gait normal with good balance and coordination.  MSK: Right shoulder shows significant improvement.  5 strength in all planes.  The patient has no pain with O'Brien's at the moment.    Impression and Recommendations:     The above documentation has been reviewed and is accurate and complete Stuart Saa, DO

## 2021-08-20 ENCOUNTER — Ambulatory Visit (INDEPENDENT_AMBULATORY_CARE_PROVIDER_SITE_OTHER): Payer: 59 | Admitting: Family Medicine

## 2021-08-20 ENCOUNTER — Other Ambulatory Visit: Payer: Self-pay

## 2021-08-20 DIAGNOSIS — S46811A Strain of other muscles, fascia and tendons at shoulder and upper arm level, right arm, initial encounter: Secondary | ICD-10-CM

## 2021-08-20 NOTE — Assessment & Plan Note (Signed)
Patient is doing significantly better at this time.  Has had no significant pain.  Is making significant improvement as well.  Discussed icing regimen and home exercises.  Increase activity slowly.  Follow-up again as needed

## 2021-08-20 NOTE — Patient Instructions (Signed)
Shoulder looks great Look into possibly being a scribe in the hospital If you have questions send mychart message See me when you need me

## 2022-03-31 ENCOUNTER — Encounter: Payer: Self-pay | Admitting: Urgent Care

## 2022-03-31 ENCOUNTER — Ambulatory Visit
Admission: EM | Admit: 2022-03-31 | Discharge: 2022-03-31 | Disposition: A | Discharge status: Home / Self Care | Payer: 59 | Attending: Urgent Care | Admitting: Urgent Care

## 2022-03-31 DIAGNOSIS — R634 Abnormal weight loss: Secondary | ICD-10-CM | POA: Diagnosis not present

## 2022-03-31 DIAGNOSIS — R630 Anorexia: Secondary | ICD-10-CM | POA: Diagnosis not present

## 2022-03-31 DIAGNOSIS — R112 Nausea with vomiting, unspecified: Secondary | ICD-10-CM

## 2022-03-31 MED ORDER — ESOMEPRAZOLE MAGNESIUM 20 MG PO CPDR
20.0000 mg | DELAYED_RELEASE_CAPSULE | Freq: Every day | ORAL | 1 refills | Status: AC
Start: 2022-03-31 — End: ?

## 2022-03-31 MED ORDER — FAMOTIDINE 20 MG PO TABS: 20.0000 mg | ORAL_TABLET | Freq: Two times a day (BID) | ORAL | 0 refills | Status: AC

## 2022-03-31 MED ORDER — ONDANSETRON 8 MG PO TBDP: 8.0000 mg | ORAL_TABLET | Freq: Three times a day (TID) | ORAL | 0 refills | Status: AC | PRN

## 2022-03-31 NOTE — ED Provider Notes (Signed)
[facility]  Note:  This document was prepared using Conservation officer, historic buildings and may include unintentional dictation errors.  MRN: [id number] DOB: 05-Jan-2001  Subjective:   Stuart Hardin is a 21 y.o. male presenting for 6 month history of decreased appetite, nausea and vomiting in the past 2 months. Has lost ~45lbs over the past year. Has also had fitful sleep. No fever, night sweats, rashes, cough, chest pain, shob, belly pain, urine symptoms. No alcohol or drug use currently. Patient is not sexually active. 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 More recently has been vaping intermittently. No family history of cancer. No known chronic conditions.   No current facility-administered medications for this encounter. No current outpatient medications on file.   No Known Allergies  History reviewed. No pertinent past medical history.   History reviewed. No pertinent surgical history.  History reviewed. No pertinent family history.  Social History   Tobacco Use   Smoking status: Never   Smokeless tobacco: Never  Substance Use Topics   Alcohol use: Yes    Comment: occasionally   Drug use: Yes    Types: Marijuana    ROS   Objective:   Vitals: BP 119/71 (BP Location: Left Arm)   Pulse 71   Temp 98.4 F (36.9 C) (Oral)   Resp 14   SpO2 98%   Physical Exam Constitutional:      General: He is not in acute distress.    Appearance: Normal appearance. He is well-developed and normal weight. He is not ill-appearing, toxic-appearing or diaphoretic.  HENT:     Head: Normocephalic and atraumatic.     Right Ear: External ear normal.     Left Ear: External ear normal.     Nose: Nose normal.     Mouth/Throat:     Mouth: Mucous membranes are moist.     Pharynx: No pharyngeal swelling, oropharyngeal exudate, posterior oropharyngeal erythema or uvula swelling.     Tonsils: No tonsillar exudate or tonsillar abscesses. 0 on the right. 0 on the left.  Eyes:      General: No scleral icterus.       Right eye: No discharge.        Left eye: No discharge.     Extraocular Movements: Extraocular movements intact.     Conjunctiva/sclera: Conjunctivae normal.  Cardiovascular:     Rate and Rhythm: Normal rate and regular rhythm.     Heart sounds: No murmur heard.    No friction rub. No gallop.  Pulmonary:     Effort: Pulmonary effort is normal. No respiratory distress.     Breath sounds: No wheezing or rales.  Abdominal:     General: Bowel sounds are normal. There is no distension.     Palpations: Abdomen is soft. There is no mass.     Tenderness: There is no abdominal tenderness. There is no right CVA tenderness, left CVA tenderness, guarding or rebound.  Musculoskeletal:     Cervical back: Normal range of motion.  Skin:    General: Skin is warm and dry.  Neurological:     Mental Status: He is alert and oriented to person, place, and time.  Psychiatric:        Mood and Affect: Mood normal.        Behavior: Behavior normal.        Thought Content: Thought content normal.        Judgment: Judgment normal.     Assessment and Plan :  PDMP not reviewed this encounter.  1. Nausea and vomiting, unspecified vomiting type   2. Decreased appetite   3. Loss of weight    Symptoms are concerning but has negative red flag symptoms apart from the weight loss, decreased appetite.  As such, will pursue labs, referral to GI. Start Zofran, famotidine and Nexium for supportive care. Patient has an appointment coming up with a new PCP. Counseled patient on potential for adverse effects with medications prescribed/recommended today, ER and return-to-clinic precautions discussed, patient verbalized understanding.    Jaynee Eagles, [place] 03/31/22 1342

## 2022-03-31 NOTE — ED Triage Notes (Signed)
Patient presents to UC for n/v x 2 months, 6 month decreased appetite.

## 2022-03-31 NOTE — ED Notes (Signed)
Provider triaged.  

## 2022-04-01 ENCOUNTER — Ambulatory Visit: Payer: 59 | Admitting: Gastroenterology

## 2022-04-01 ENCOUNTER — Encounter: Payer: Self-pay | Admitting: Gastroenterology

## 2022-04-01 VITALS — BP 124/68 | HR 81 | Ht 73.0 in | Wt 162.4 lb

## 2022-04-01 DIAGNOSIS — R112 Nausea with vomiting, unspecified: Secondary | ICD-10-CM | POA: Diagnosis not present

## 2022-04-01 DIAGNOSIS — R630 Anorexia: Secondary | ICD-10-CM

## 2022-04-01 DIAGNOSIS — R634 Abnormal weight loss: Secondary | ICD-10-CM

## 2022-04-01 DIAGNOSIS — R6881 Early satiety: Secondary | ICD-10-CM | POA: Diagnosis not present

## 2022-04-01 LAB — COMPREHENSIVE METABOLIC PANEL
ALT: 19 IU/L (ref 0–44)
AST: 17 IU/L (ref 0–40)
Albumin/Globulin Ratio: 1.8 (ref 1.2–2.2)
Albumin: 4.8 g/dL (ref 4.3–5.2)
Alkaline Phosphatase: 95 IU/L (ref 51–125)
BUN/Creatinine Ratio: 14 (ref 9–20)
BUN: 14 mg/dL (ref 6–20)
Bilirubin Total: 0.4 mg/dL (ref 0.0–1.2)
CO2: 25 mmol/L (ref 20–29)
Calcium: 9.9 mg/dL (ref 8.7–10.2)
Chloride: 101 mmol/L (ref 96–106)
Creatinine, Ser: 1.02 mg/dL (ref 0.76–1.27)
Globulin, Total: 2.6 g/dL (ref 1.5–4.5)
Glucose: 95 mg/dL (ref 70–99)
Potassium: 4.6 mmol/L (ref 3.5–5.2)
Sodium: 139 mmol/L (ref 134–144)
Total Protein: 7.4 g/dL (ref 6.0–8.5)
eGFR: 108 mL/min/{1.73_m2} (ref >59)

## 2022-04-01 LAB — TSH: TSH: 1.32 u[IU]/mL (ref 0.450–4.500)

## 2022-04-01 LAB — CBC
Hematocrit: 43.3 % (ref 37.5–51.0)
Hemoglobin: 14.3 g/dL (ref 13.0–17.7)
MCH: 29.4 pg (ref 26.6–33.0)
MCHC: 33 g/dL (ref 31.5–35.7)
MCV: 89 fL (ref 79–97)
Platelets: 309 10*3/uL (ref 150–450)
RBC: 4.87 x10E6/uL (ref 4.14–5.80)
RDW: 12.5 % (ref 11.6–15.4)
WBC: 6.5 10*3/uL (ref 3.4–10.8)

## 2022-04-01 NOTE — Patient Instructions (Addendum)
_______________________________________________________ If you are age 20 or younger, your body mass index should be between 19-25. Your Body mass index is 21.42 kg/m. If this is out of the aformentioned range listed, please consider follow up with your Primary Care Provider.   ________________________________________________________  The [facility] providers would like to encourage you to use [facility] to communicate with providers for non-urgent requests or questions.  Due to long hold times on the telephone, sending your provider a message by [facility] may be a faster and more efficient way to get a response.  Please allow 48 business hours for a response.  Please remember that this is for non-urgent requests.  _______________________________________________________  Due to recent changes in healthcare laws, you may see the results of your imaging and laboratory studies on MyChart before your provider has had a chance to review them.  We understand that in some cases there may be results that are confusing or concerning to you. Not all laboratory results come back in the same time frame and the provider may be waiting for multiple results in order to interpret others.  Please give [place] 48 hours in order for your provider to thoroughly review all the results before contacting the office for clarification of your results.   You have been scheduled for an endoscopy. Please follow written instructions given to you at your visit today. If you use inhalers (even only as needed), please bring them with you on the day of your procedure.     Thank you for choosing me and [facility].  Vito Cirigliano, D.O.

## 2022-04-01 NOTE — Progress Notes (Signed)
-             Chief Complaint: Nausea/vomiting, weight loss   Referring Provider:     Self    HPI:     Stuart Hardin is a 21 y.o. male referred to the Gastroenterology Clinic for evaluation of 76-month history of decreased appetite along with nausea/vomiting for the last 2 months.    Approx 6 months ago was trying to put on muscle weight using creatine and increased caloric intake.  Increase from baseline 183  lb to peak of 190 lb.  However, developed decreased PO tolerance, early satiety, and decreased hunger. Decreased caloric intake tolerance from 2500 cal/day to 1500 cal/day. No dysphagia.   Developed nausea over the last 2 months.  Nausea worse in the morning, and vomits approximately 3 times/week. No hematemesis.  No melena or hematochezia , change in stools, abdominal pain, fever.  No heartburn, regurgitation.  No prior similar sxs. No prior EGD, colonoscopy.   Vape daily, but no THC, edibles, etc.  No EtOH, illicit drug use. Currently student at [employer].   Was seen at Urgent Care yesterday for this same issue. - Normal CBC, CMP, TSH - Prescribed Zofran, famotidine, Nexium (has not yet started any of these due to appointment with me today), and GI referral along with follow-up with new PCM to establish care  Weight trend in EMR: - 04/05/2021: 169 lb - 05/17/2021: 167 lb - 08/20/2021: 175 lb - Today: 162 lb  Father with EGD for decreased appetite ~1 year ago; unsure of results. Otherwise, no known family history of CRC, GI malignancy, liver disease, pancreatic disease, or IBD.       Latest Ref Rng & Units 03/31/2022   12:48 PM  CBC  WBC 3.4 - 10.8 x10E3/uL 6.5   Hemoglobin 13.0 - 17.7 g/dL 14.3   Hematocrit 37.5 - 51.0 % 43.3   Platelets 150 - 450 x10E3/uL 309       Latest Ref Rng & Units 03/31/2022   12:48 PM 07/01/2021    4:03 PM  CMP  Glucose 70 - 99 mg/dL 95  88   BUN 6 - 20 mg/dL 14  11   Creatinine 0.76 - 1.27 mg/dL 1.02  0.95   Sodium 134 - 144 mmol/L 139  133    Potassium 3.5 - 5.2 mmol/L 4.6  4.5   Chloride 96 - 106 mmol/L 101  97   CO2 20 - 29 mmol/L 25  19   Calcium 8.7 - 10.2 mg/dL 9.9  9.7   Total Protein 6.0 - 8.5 g/dL 7.4  8.1   Total Bilirubin 0.0 - 1.2 mg/dL 0.4  0.6   Alkaline Phos 51 - 125 IU/L 95  135   AST 0 - 40 IU/L 17  33   ALT 0 - 44 IU/L 19  33       No past medical history on file.   No past surgical history on file. No family history on file. Social History   Tobacco Use   Smoking status: Never   Smokeless tobacco: Never  Vaping Use   Vaping Use: Every day  Substance Use Topics   Alcohol use: Not Currently    Comment: occasionally   Drug use: Not Currently    Types: Marijuana   Current Outpatient Medications  Medication Sig Dispense Refill   esomeprazole (NEXIUM) 20 MG capsule Take 1 capsule (20 mg total) by mouth daily before breakfast. 90 capsule 1   famotidine (PEPCID) 20 MG tablet  Take 1 tablet (20 mg total) by mouth 2 (two) times daily. 30 tablet 0   ondansetron (ZOFRAN-ODT) 8 MG disintegrating tablet Take 1 tablet (8 mg total) by mouth every 8 (eight) hours as needed for nausea or vomiting. 20 tablet 0   No current facility-administered medications for this visit.   No Known Allergies   Review of Systems: All systems reviewed and negative except where noted in HPI.     Physical Exam:    Wt Readings from Last 3 Encounters:  08/20/21 175 lb (79.4 kg)  06/17/21 170 lb (77.1 kg) (71 %, Z= 0.54)*  05/17/21 167 lb (75.8 kg) (67 %, Z= 0.45)*   * Growth percentiles are based on CDC (Boys, 2-20 Years) data.    There were no vitals taken for this visit. Constitutional:  Pleasant, in no acute distress. Psychiatric: Normal mood and affect. Behavior is normal. Cardiovascular: Normal rate, regular rhythm. No edema Pulmonary/chest: Effort normal and breath sounds normal. No wheezing, rales or rhonchi. Abdominal: Soft, nondistended, nontender. Bowel sounds active throughout. There are no masses  palpable. No hepatomegaly. Neurological: Alert and oriented to person place and time. Skin: Skin is warm and dry. No rashes noted.   ASSESSMENT AND PLAN;   1) Early satiety 2) Nausea/Vomiting 3) Weight loss 4) Decreased appetite  Discussed potential etiologies for presenting symptoms at length today, to include full DDx.  Plan for the following: - EGD to evaluate for mucosal/luminal pathology to include PUD, H. pylori, GOO - Symptoms do not sound consistent with gastroparesis, but depending on EGD findings could consider GES - Starting Nexium 20 mg/daily as prescribed by Urgent Care - Starting Zofran ODT prn as prescribed by Urgent Care - Can hold off on starting Pepcid for the time being - If EGD unrevealing and symptoms persist, plan for CT A/P and CT head - Continue p.o. intake as tolerated   The indications, risks, and benefits of EGD were explained to the patient in detail. Risks include but are not limited to bleeding, perforation, adverse reaction to medications, and cardiopulmonary compromise. Sequelae include but are not limited to the possibility of surgery, hospitalization, and mortality. The patient verbalized understanding and wished to proceed. All questions answered, referred to scheduler. Further recommendations pending results of the exam.     Verlin Dike Charlaine Utsey, DO, FACG  04/01/2022, 3:14 PM   No ref. provider found

## 2022-04-05 ENCOUNTER — Ambulatory Visit: Payer: 59 | Admitting: Gastroenterology

## 2022-04-07 ENCOUNTER — Encounter: Payer: Self-pay | Admitting: Gastroenterology

## 2022-04-07 ENCOUNTER — Ambulatory Visit (AMBULATORY_SURGERY_CENTER): Payer: 59 | Admitting: Gastroenterology

## 2022-04-07 VITALS — BP 110/44 | HR 50 | Temp 97.1°F | Resp 15 | Ht 73.0 in | Wt 162.0 lb

## 2022-04-07 DIAGNOSIS — R634 Abnormal weight loss: Secondary | ICD-10-CM

## 2022-04-07 DIAGNOSIS — R630 Anorexia: Secondary | ICD-10-CM

## 2022-04-07 DIAGNOSIS — R6881 Early satiety: Secondary | ICD-10-CM

## 2022-04-07 DIAGNOSIS — R112 Nausea with vomiting, unspecified: Secondary | ICD-10-CM

## 2022-04-07 DIAGNOSIS — K3189 Other diseases of stomach and duodenum: Secondary | ICD-10-CM | POA: Diagnosis not present

## 2022-04-07 MED ORDER — SODIUM CHLORIDE 0.9 % IV SOLN: 500.0000 mL | Freq: Once | INTRAVENOUS | Status: DC

## 2022-04-07 NOTE — Progress Notes (Signed)
Called to room to assist during endoscopic procedure.  Patient ID and intended procedure confirmed with present staff. Received instructions for my participation in the procedure from the performing physician.  

## 2022-04-07 NOTE — Progress Notes (Signed)
To pacu, VSS. Report to RN.tb 

## 2022-04-07 NOTE — Progress Notes (Signed)
VS by [place]. 

## 2022-04-07 NOTE — Op Note (Signed)
[facility] Patient Name: Stuart Hardin Procedure Date: 04/07/2022 1:26 PM MRN: [id number] Endoscopist: Doristine Locks , MD Age: 21 Referring MD:  Date of Birth: 01-17-2001 Gender: Male Account #: [id number] Procedure:                Upper GI endoscopy Indications:              Early satiety, Nausea with vomiting, Weight loss,                            Decreased appetite Medicines:                Monitored Anesthesia Care Procedure:                Pre-Anesthesia Assessment:                           - Prior to the procedure, a History and Physical                            was performed, and patient medications and                            allergies were reviewed. The patient's tolerance of                            previous anesthesia was also reviewed. The risks                            and benefits of the procedure and the sedation                            options and risks were discussed with the patient.                            All questions were answered, and informed consent                            was obtained. Prior Anticoagulants: The patient has                            taken no previous anticoagulant or antiplatelet                            agents. ASA Grade Assessment: II - A patient with                            mild systemic disease. After reviewing the risks                            and benefits, the patient was deemed in                            satisfactory condition to undergo the procedure.  After obtaining informed consent, the endoscope was                            passed under direct vision. Throughout the                            procedure, the patient's blood pressure, pulse, and                            oxygen saturations were monitored continuously. The                            GIF [id number] #[id number] was introduced through the                            mouth, and advanced to the third part of  duodenum.                            The upper GI endoscopy was accomplished without                            difficulty. The patient tolerated the procedure                            well. Scope In: Scope Out: Findings:                 The examined esophagus was normal.                           The Z-line was regular and was found 44 cm from the                            incisors.                           The entire examined stomach was normal. Biopsies                            were taken with a cold forceps for Helicobacter                            pylori testing. Estimated blood loss was minimal.                           The examined duodenum was normal. Biopsies were                            taken with a cold forceps for histology. Estimated                            blood loss was minimal. Complications:            No immediate complications. Estimated Blood Loss:     Estimated blood loss was minimal. Impression:               -  Normal esophagus.                           - Z-line regular, 44 cm from the incisors.                           - Normal stomach. Biopsied.                           - Normal examined duodenum. Biopsied. Recommendation:           - Patient has a contact number available for                            emergencies. The signs and symptoms of potential                            delayed complications were discussed with the                            patient. Return to normal activities tomorrow.                            Written discharge instructions were provided to the                            patient.                           - Resume previous diet.                           - Continue present medications.                           - Await pathology results.                           - Trial course of OTC FDGuard.                           - Return to GI clinic PRN.                           - If biopsies unrevealing and symptoms  persist,                            will plan for CT abdomen/pelvis and CT Head with                            continued evaluation in the GI Clinic. Doristine Locks, MD 04/07/2022 1:43:23 PM

## 2022-04-07 NOTE — Patient Instructions (Signed)
Resume previous diet. - Continue present medications. - Await pathology results. - Trial course of OTC FDGuard. - Return to GI clinic PRN. - If biopsies unrevealing and symptoms persist, will plan for CT abdomen/pelvis and CT  YOU HAD AN ENDOSCOPIC PROCEDURE TODAY: Refer to the procedure report and other information in the discharge instructions given to you for any specific questions about what was found during the examination. If this information does not answer your questions, please call [place] office at [phone] to clarify.   YOU SHOULD EXPECT: Some feelings of bloating in the abdomen. Passage of more gas than usual. Walking can help get rid of the air that was put into your GI tract during the procedure and reduce the bloating. If you had a lower endoscopy (such as a colonoscopy or flexible sigmoidoscopy) you may notice spotting of blood in your stool or on the toilet paper. Some abdominal soreness may be present for a day or two, also.  DIET: Your first meal following the procedure should be a light meal and then it is ok to progress to your normal diet. A half-sandwich or bowl of soup is an example of a good first meal. Heavy or fried foods are harder to digest and may make you feel nauseous or bloated. Drink plenty of fluids but you should avoid alcoholic beverages for 24 hours. If you had a esophageal dilation, please see attached instructions for diet.    ACTIVITY: Your care partner should take you home directly after the procedure. You should plan to take it easy, moving slowly for the rest of the day. You can resume normal activity the day after the procedure however YOU SHOULD NOT DRIVE, use power tools, machinery or perform tasks that involve climbing or major physical exertion for 24 hours (because of the sedation medicines used during the test).   SYMPTOMS TO REPORT IMMEDIATELY: A gastroenterologist can be reached at any hour. Please call [phone]  for any of the following  symptoms:  Following upper endoscopy (EGD, EUS, ERCP, esophageal dilation) Vomiting of blood or coffee ground material  New, significant abdominal pain  New, significant chest pain or pain under the shoulder blades  Painful or persistently difficult swallowing  New shortness of breath  Black, tarry-looking or red, bloody stools  FOLLOW UP:  If any biopsies were taken you will be contacted by phone or by letter within the next 1-3 weeks. Call [phone]  if you have not heard about the biopsies in 3 weeks.  Please also call with any specific questions about appointments or follow up tests.

## 2022-04-07 NOTE — Progress Notes (Signed)
GASTROENTEROLOGY PROCEDURE H&P NOTE   Primary Care Physician: Heron Nay, PA    Reason for Procedure:  Nausea/vomiting, weight loss, decreased appetite, early satiety  Plan:    EGD  Patient is appropriate for endoscopic procedure(s) in the ambulatory ([facility]) setting.  The nature of the procedure, as well as the risks, benefits, and alternatives were carefully and thoroughly reviewed with the patient. Ample time for discussion and questions allowed. The patient understood, was satisfied, and agreed to proceed.     HPI: Stuart Hardin is a 21 y.o. male who presents for EGD for evaluation of multiple upper GI symptoms to include nausea/vomiting, weight loss, decreased appetite, early satiety.  Patient was most recently seen in the Gastroenterology Clinic on 04/01/2022 by me.  Was started on Nexium 20 mg/day and Zofran as needed.  Otherwise, no interval change in medical history since that appointment. Please refer to that note for full details regarding GI history and clinical presentation.   History reviewed. No pertinent past medical history.  History reviewed. No pertinent surgical history.  Prior to Admission medications   Medication Sig Start Date End Date Taking? Authorizing Provider  esomeprazole (NEXIUM) 20 MG capsule Take 1 capsule (20 mg total) by mouth daily before breakfast. Patient not taking: Reported on 04/07/2022 03/31/22   Wallis Bamberg, PA-C  famotidine (PEPCID) 20 MG tablet Take 1 tablet (20 mg total) by mouth 2 (two) times daily. Patient not taking: Reported on 04/07/2022 03/31/22   Wallis Bamberg, PA-C  ondansetron (ZOFRAN-ODT) 8 MG disintegrating tablet Take 1 tablet (8 mg total) by mouth every 8 (eight) hours as needed for nausea or vomiting. Patient not taking: Reported on 04/07/2022 03/31/22   Wallis Bamberg, PA-C    Current Outpatient Medications  Medication Sig Dispense Refill   esomeprazole (NEXIUM) 20 MG capsule Take 1 capsule (20 mg total) by mouth daily before  breakfast. (Patient not taking: Reported on 04/07/2022) 90 capsule 1   famotidine (PEPCID) 20 MG tablet Take 1 tablet (20 mg total) by mouth 2 (two) times daily. (Patient not taking: Reported on 04/07/2022) 30 tablet 0   ondansetron (ZOFRAN-ODT) 8 MG disintegrating tablet Take 1 tablet (8 mg total) by mouth every 8 (eight) hours as needed for nausea or vomiting. (Patient not taking: Reported on 04/07/2022) 20 tablet 0   Current Facility-Administered Medications  Medication Dose Route Frequency Provider Last Rate Last Admin   0.9 %  sodium chloride infusion  500 mL Intravenous Once Chiamaka Latka V, DO        Allergies as of 04/07/2022   (No Known Allergies)    Family History  Problem Relation Age of Onset   Dementia Paternal Grandmother    Colon cancer Neg Hx    Esophageal cancer Neg Hx    Rectal cancer Neg Hx    Stomach cancer Neg Hx     Social History   Socioeconomic History   Marital status: Single    Spouse name: Not on file   Number of children: 0   Years of education: Not on file   Highest education level: Not on file  Occupational History   Occupation: server  Tobacco Use   Smoking status: Never   Smokeless tobacco: Never  Vaping Use   Vaping Use: Every day  Substance and Sexual Activity   Alcohol use: Not Currently    Comment: occasionally   Drug use: Not Currently    Types: Marijuana   Sexual activity: Not Currently  Other Topics Concern  Not on file  Social History Narrative   Not on file   Social Determinants of Health   Financial Resource Strain: Not on file  Food Insecurity: Not on file  Transportation Needs: Not on file  Physical Activity: Not on file  Stress: Not on file  Social Connections: Not on file  Intimate Partner Violence: Not on file    Physical Exam: Vital signs in last 24 hours: @BP  131/84   Pulse (!) 57   Temp (!) 97.1 F (36.2 C)   Ht 6\' 1"  (1.854 m)   Wt 162 lb (73.5 kg)   SpO2 100%   BMI 21.37 kg/m  GEN: NAD EYE:  Sclerae anicteric ENT: MMM CV: Non-tachycardic Pulm: CTA b/l GI: Soft, NT/ND NEURO:  Alert & Oriented x 3   Stuart Heck, DO [facility]   04/07/2022 1:15 PM

## 2022-04-08 ENCOUNTER — Telehealth: Payer: Self-pay | Admitting: *Deleted

## 2022-04-08 NOTE — Telephone Encounter (Signed)
  Follow up Call-     04/07/2022   12:51 PM  Call back number  Post procedure Call Back phone  # [phone]  Permission to leave phone message Yes     Patient questions:  Do you have a fever, pain , or abdominal swelling? No. Pain Score  0 *  Have you tolerated food without any problems? Yes.    Have you been able to return to your normal activities? Yes.    Do you have any questions about your discharge instructions: Diet   No. Medications  No. Follow up visit  No.  Do you have questions or concerns about your Care? No.  Actions: * If pain score is 4 or above: No action needed, pain <4.

## 2022-05-19 ENCOUNTER — Telehealth: Payer: Self-pay | Admitting: Gastroenterology

## 2022-05-19 NOTE — Telephone Encounter (Signed)
Patent called states his symptoms are getting worst. He is now losing weight is having extremely frequent BM's. Urgently seeking advise.

## 2022-05-20 NOTE — Telephone Encounter (Signed)
Pt scheduled for appointment with Quentin Mulling PA on 11/29 at 1:30 pm. Pt states he still has a decreased appetite and weight loss. Pt also has nausea and vomiting. Vomiting seems to be worse in the morning. Pt states he is currently out of Zofran.  Pt is not currently taking FD guard because he states it was hard to find. Pt is still taking nexium daily.  Pt also stated that dermatology put him on doxycyline for acne and he felt like this helped with his appetite initially. Pt also reports he has had more frequent bowel movements. Stools are loose with solid pieces. Pt has about 6 BMs per day.   As DOD please advise.

## 2022-06-07 NOTE — Progress Notes (Unsigned)
06/08/2022 Stuart Hardin [id number] [birth year]  Referring provider: Rich Fuchs, PA Primary GI doctor: Dr. Bryan Lemma  ASSESSMENT AND PLAN:   Early satiety and loss of appetite Weight is currently steady, patient denies any further nausea and vomiting. 04/07/2022 EGD unremarkable, FDGard not helpful States doxycycline for acne helps with appetite question SIBO but patient does not have the other symptoms. We will do trial of Florastor, given FODMAP diet Question if may have anxiety as a component, may need more broad work-up within the GI. We will get labs to evaluate CBC, CMET, cortisol, lipase, sed rate and CRP to evaluate for inflammation. If there is any abnormalities in lab will consider CT abdomen pelvis to evaluate further. Follow-up 2 to 3 months.  Loose stools while on doxycycline This is resolved since being off of it. Denies hematochezia or mucus. Add on Florastor for at least a month Check sed rate and CRP, if elevated get CT abdomen pelvis with enterography   Patient Care Team: Rich Fuchs, PA as PCP - General (Physician Assistant)  HISTORY OF PRESENT ILLNESS: 21 y.o. male presents for evaluation of loss of appetite, nausea, vomiting and loose stools.   04/01/2022 office visit with Dr. Bryan Lemma for nausea vomiting.   Normal CBC, CMET, and TSH. 04/07/2022 upper endoscopy for nausea unremarkable biopsies showed no evidence of celiac disease, no evidence of inflammation, negative H. pylori. Suggested trial of FD guard which patient tried for a month but it did not help.   States in last 2 months still struggling with appetite.  Had acne flare up and saw derm, given doxycycline 200 mg a day and over a course of a week was improved. Finished his round 2 weeks ago and appetite has not improved.  Denies further nausea or vomiting in 1-2 months.  Denies any Ab pain.  He has like some burping after meals, denies bloating.  He has had some intermittent loose  stools since being on doxy and eating more.  Would have urgency after every meal with loose stools, not diarrhea.  No hemoatchezia, no mucus.  No nocturnal symptoms.  Since being off the doxycycline has had 1-2 Bm's daily formed, non urgent stool except occ in the morning.  No ETOH, no NSAIDS.  He has added 2 grams of fish oil a month ago, was on it before, helping appetite some.  Magnesium L3 in the morning and magnesium glacinate at night about 2 weeks.  Sesame oil, and vitamin D .   Wt Readings from Last 3 Encounters:  06/08/22 161 lb 8 oz (73.3 kg)  04/07/22 162 lb (73.5 kg)  04/01/22 162 lb 6 oz (73.7 kg)   Has not had imaging.  CBC  03/31/2022  HGB 14.3 MCV 89 without evidence of anemia WBC 6.5 Platelets 309 Kidney function 03/31/2022  BUN 14 Cr 1.02 .  Potassium 4.6   LFTs 03/31/2022  AST 17 ALT 19 Alkphos 95 TBili 0.4 ( had previous alk phos 135 2022) 03/31/2022 TSH 1.320  He  reports that he has never smoked. He has never used smokeless tobacco. He reports that he does not currently use alcohol. He reports that he does not currently use drugs after having used the following drugs: Marijuana.  Current Medications:        Current Outpatient Medications (Other):    esomeprazole (NEXIUM) 20 MG capsule, Take 1 capsule (20 mg total) by mouth daily before breakfast. (Patient not taking: Reported on 04/07/2022)   famotidine (PEPCID) 20  MG tablet, Take 1 tablet (20 mg total) by mouth 2 (two) times daily. (Patient not taking: Reported on 04/07/2022)   ondansetron (ZOFRAN-ODT) 8 MG disintegrating tablet, Take 1 tablet (8 mg total) by mouth every 8 (eight) hours as needed for nausea or vomiting. (Patient not taking: Reported on 04/07/2022)  Medical History: History reviewed. No pertinent past medical history. Allergies: No Known Allergies   Surgical History:  He  has no past surgical history on file. Family History:  His family history includes Dementia in his paternal  grandmother.  REVIEW OF SYSTEMS  : All other systems reviewed and negative except where noted in the History of Present Illness.  PHYSICAL EXAM: BP 110/70   Pulse 62   Ht _0  (1.854 m)   Wt 161 lb 8 oz (73.3 kg)   BMI 21.31 kg/m  General:   Pleasant, well developed male in no acute distress Head:   Normocephalic and atraumatic. Eyes:  sclerae anicteric,conjunctive pink  Heart:   regular rate and rhythm Pulm:  Clear anteriorly; no wheezing Abdomen:   Soft, Flat AB, Active bowel sounds. No tenderness . Without guarding and Without rebound, No organomegaly appreciated. Rectal: Not evaluated Extremities:  Without edema. Msk: Symmetrical without gross deformities. Peripheral pulses intact.  Neurologic:  Alert and  oriented x4;  No focal deficits.  Skin:   Dry and intact without significant lesions or rashes. Psychiatric:  Cooperative. Normal mood and affect.    Stuart Crofts, PA-C 1:57 PM

## 2022-06-08 ENCOUNTER — Encounter: Payer: Self-pay | Admitting: Physician Assistant

## 2022-06-08 ENCOUNTER — Other Ambulatory Visit (INDEPENDENT_AMBULATORY_CARE_PROVIDER_SITE_OTHER): Payer: 59

## 2022-06-08 ENCOUNTER — Ambulatory Visit: Payer: 59 | Admitting: Physician Assistant

## 2022-06-08 VITALS — BP 110/70 | HR 62 | Ht 73.0 in | Wt 161.5 lb

## 2022-06-08 DIAGNOSIS — R6881 Early satiety: Secondary | ICD-10-CM | POA: Diagnosis not present

## 2022-06-08 DIAGNOSIS — R630 Anorexia: Secondary | ICD-10-CM | POA: Diagnosis not present

## 2022-06-08 LAB — COMPREHENSIVE METABOLIC PANEL
ALT: 21 U/L (ref 0–53)
AST: 22 U/L (ref 0–37)
Albumin: 5.1 g/dL (ref 3.5–5.2)
Alkaline Phosphatase: 83 U/L (ref 39–117)
BUN: 15 mg/dL (ref 6–23)
CO2: 29 mEq/L (ref 19–32)
Calcium: 10.2 mg/dL (ref 8.4–10.5)
Chloride: 101 mEq/L (ref 96–112)
Creatinine, Ser: 1.01 mg/dL (ref 0.40–1.50)
GFR: 106.81 mL/min (ref >60.00)
Glucose, Bld: 85 mg/dL (ref 70–99)
Potassium: 4.8 mEq/L (ref 3.5–5.1)
Sodium: 136 mEq/L (ref 135–145)
Total Bilirubin: 0.5 mg/dL (ref 0.2–1.2)
Total Protein: 7.9 g/dL (ref 6.0–8.3)

## 2022-06-08 LAB — CBC WITH DIFFERENTIAL/PLATELET
Basophils Absolute: 0 10*3/uL (ref 0.0–0.1)
Basophils Relative: 0.6 % (ref 0.0–3.0)
Eosinophils Absolute: 0.3 10*3/uL (ref 0.0–0.7)
Eosinophils Relative: 4.4 % (ref 0.0–5.0)
HCT: 43.7 % (ref 39.0–52.0)
Hemoglobin: 14.9 g/dL (ref 13.0–17.0)
Lymphocytes Relative: 27.6 % (ref 12.0–46.0)
Lymphs Abs: 1.8 10*3/uL (ref 0.7–4.0)
MCHC: 34 g/dL (ref 30.0–36.0)
MCV: 87.8 fl (ref 78.0–100.0)
Monocytes Absolute: 0.5 10*3/uL (ref 0.1–1.0)
Monocytes Relative: 8.1 % (ref 3.0–12.0)
Neutro Abs: 3.8 10*3/uL (ref 1.4–7.7)
Neutrophils Relative %: 59.3 % (ref 43.0–77.0)
Platelets: 313 10*3/uL (ref 150.0–400.0)
RBC: 4.98 Mil/uL (ref 4.22–5.81)
RDW: 12.9 % (ref 11.5–14.6)
WBC: 6.5 10*3/uL (ref 4.5–10.5)

## 2022-06-08 LAB — CORTISOL: Cortisol, Plasma: 4.8 ug/dL

## 2022-06-08 LAB — HIGH SENSITIVITY CRP: CRP, High Sensitivity: <0.2 mg/L (ref 0.000–5.000)

## 2022-06-08 LAB — SEDIMENTATION RATE: Sed Rate: 3 mm/hr (ref 0–15)

## 2022-06-08 LAB — LIPASE: Lipase: 24 U/L (ref 11.0–59.0)

## 2022-06-08 NOTE — Patient Instructions (Addendum)
Your provider has requested that you go to the basement level for lab work before leaving today. Press "B" on the elevator. The lab is located at the first door on the left as you exit the elevator.   Please follow up with Marchelle Folks or Dr. Barron Alvine in 3 months  First do a trial off milk/lactose products if you use them.   Do trial of FLORASTOR twice a day for at least a month.   We may want to evaluate you for small intestinal bacterial overgrowth, this can cause increase gas, bloating, loose stools or constipation.  There is a test for this we can do or sometimes we will treat a patient with an antibiotic, xifaxin, to see if it helps.    Please try low FODMAP diet- see below- start with eliminating just one column at a time, the table at the very bottom contains foods that are safe to take   FODMAP stands for fermentable oligo-, di-, mono-saccharides and polyols (1). These are the scientific terms used to classify groups of carbs that are notorious for triggering digestive symptoms like bloating, gas and stomach pain.

## 2022-06-13 NOTE — Progress Notes (Signed)
Agree with the assessment and plan as outlined by Quentin Mulling, PA-C. Agree that the temporal relationship with improvement and recent course of doxy is suspicious for SIBO. Agree with plan as outlined. If recurrence, can consider course of rifaximin as well.   Roselee Tayloe, DO, [facility]

## 2022-06-23 ENCOUNTER — Ambulatory Visit: Payer: 59 | Admitting: Nurse Practitioner

## 2022-09-08 ENCOUNTER — Encounter: Payer: Self-pay | Admitting: Allergy

## 2022-09-08 ENCOUNTER — Ambulatory Visit: Payer: 59 | Admitting: Allergy

## 2022-09-08 VITALS — BP 118/80 | HR 50 | Temp 98.0°F | Resp 18 | Ht 72.0 in | Wt 163.8 lb

## 2022-09-08 DIAGNOSIS — R198 Other specified symptoms and signs involving the digestive system and abdomen: Secondary | ICD-10-CM

## 2022-09-08 DIAGNOSIS — J3089 Other allergic rhinitis: Secondary | ICD-10-CM

## 2022-09-08 DIAGNOSIS — Z713 Dietary counseling and surveillance: Secondary | ICD-10-CM | POA: Insufficient documentation

## 2022-09-08 NOTE — Patient Instructions (Addendum)
Discussed with patient that skin prick testing and bloodwork (food IgE levels) check for IgE mediated reactions which his clinical presentation does not support.  Good news is that you seem to be doing better and had negative endoscopy and normal bloodwork.   Keep a food journal with symptoms and foods eaten. Monitor your symptoms closely after eating peanuts, wheat, corn and sesame seed. If you notice any specific foods giving you issues then we can skin test to them in the future.  For mild symptoms you can take over the counter antihistamines such as Benadryl and monitor symptoms closely. If symptoms worsen or if you have severe symptoms including breathing issues, throat closure, significant swelling, whole body hives, severe diarrhea and vomiting, lightheadedness then seek immediate medical care.  Follow up as needed.

## 2022-09-08 NOTE — Assessment & Plan Note (Signed)
Patient concerned about food allergies that may have caused his GI issues with early satiety, nausea, vomiting and 30lbs weight loss over 5-6 months. He is feeling a lot better now and has been eating more. Denies having any GI infections around onset, changes in diet or unusual travel - he did go to [place] but had symptoms beforehand. Saw GI and EGD was unremarkable and labs were normal. 2024 food panel bloodwork was positive to peanuts, wheat, corn and sesame seed; borderline to soy, milk, shrimp, walnuts. Only avoiding corn as it makes his stool different. No other symptoms.  Discussed with patient that skin prick testing and bloodwork (food IgE levels) check for IgE mediated reactions which his clinical presentation does not support.  His bloodwork may be false positive as he tolerates all those foods with no immediate reactions that he can recall.  Good news is he seems to be doing better and had negative endoscopy and normal bloodwork.  Keep a food journal with symptoms and foods eaten. Monitor your symptoms closely after eating peanuts, wheat, corn and sesame seed as those numbers were higher on the bloodwork.  If you notice any specific foods giving you issues then we can skin test to them in the future.  For mild symptoms you can take over the counter antihistamines such as Benadryl and monitor symptoms closely. If symptoms worsen or if you have severe symptoms including breathing issues, throat closure, significant swelling, whole body hives, severe diarrhea and vomiting, lightheadedness then seek immediate medical care.

## 2022-09-08 NOTE — Assessment & Plan Note (Signed)
Mild symptoms and takes Claritin prn with good benefit. Use over the counter antihistamines such as Zyrtec (cetirizine), Claritin (loratadine), Allegra (fexofenadine), or Xyzal (levocetirizine) daily as needed. May take twice a day during allergy flares. May switch antihistamines every few months. Monitor symptoms.

## 2022-09-08 NOTE — Progress Notes (Signed)
New Patient Note  RE: Stuart Hardin MRN: [id number] DOB: 2000-11-18 Date of Office Visit: 09/08/2022  Consult requested by: Rich Fuchs, PA Primary care provider: Rich Fuchs, PA  Chief Complaint: Allergic Reaction (Large reduction of appetite for months - would like to get tested for foods )  History of Present Illness: I had the pleasure of seeing Stuart Hardin for initial evaluation at the [facility] on 09/08/2022. Stuart Hardin is a 22 y.o. male, who is referred here by Rich Fuchs, PA for the evaluation of food allergies.  Around April 2023 patient had a decrease in appetite and was only eating about 900 calories per day.  Stuart Hardin lost about 30lbs in 5-6 months. Stuart Hardin changed his diet and overall feeling a lot better now.  Stuart Hardin is eating almost like before and gained back about 8 lbs in the past 2 months.  Denies being sick around onset. Stuart Hardin did travel to [place] after this started and had similar symptoms there and here.   Stuart Hardin had symptoms of feeling early satiety and nausea with vomiting. Denies any diarrhea or constipation.  Denies any other associated symptoms.   Past work up includes: 2024 bloodwork as below.  Patient saw GI and had EGD  "-The biopsies taken from your small intestine were normal and there was no evidence of Celiac Disease. -The biopsies taken from your stomach were normal and there was no evidence of inflammation or Helicobacter pylori infection."   Dietary History: patient has been eating other foods including milk, eggs, limited peanut, limited treenuts, limited sesame, shellfish, fish, soy, limited wheat, meats, fruits and vegetables, corn.  Currently avoiding corn as it made his stool look "weird" - describes it as porous?  06/08/2022 GI visit: "Early satiety and loss of appetite Weight is currently steady, patient denies any further nausea and vomiting. 04/07/2022 EGD unremarkable, FDGard not helpful States doxycycline for acne helps with  appetite question SIBO but patient does not have the other symptoms. We will do trial of Florastor, given FODMAP diet Question if may have anxiety as a component, may need more broad work-up within the GI. We will get labs to evaluate CBC, CMET, cortisol, lipase, sed rate and CRP to evaluate for inflammation. If there is any abnormalities in lab will consider CT abdomen pelvis to evaluate further. Follow-up 2 to 3 months.   Loose stools while on doxycycline This is resolved since being off of it. Denies hematochezia or mucus. Add on Florastor for at least a month Check sed rate and CRP, if elevated get CT abdomen pelvis with enterography"  08/25/2022 bloodwork Egg White IgE Class 0 kU/L <0.10  Peanut Class II kU/L 1.12 Abnormal   Soybean IgE Class 0/I kU/L 0.26 Abnormal   Milk IgE Class 0/I kU/L 0.26 Abnormal   Clam IgE Class 0 kU/L <0.10  Shrimp IgE Class 0/I kU/L 0.16 Abnormal   Walnut IgE Class 0/I kU/L 0.29 Abnormal   Codfish IgE Class 0 kU/L <0.10  Scallop IgE Class 0 kU/L <0.10  Wheat Class I kU/L 0.55 Abnormal   Corn IgE Class II kU/L 0.70 Abnormal   Sesame Seed IgE Class II kU/L 1.37 Abnormal    Assessment and Plan: Senecca is a 22 y.o. male with: Gastrointestinal complaints Patient concerned about food allergies that may have caused his GI issues with early satiety, nausea, vomiting and 30lbs weight loss over 5-6 months. Stuart Hardin is feeling a lot better now and has been eating more. Denies  having any GI infections around onset, changes in diet or unusual travel - Stuart Hardin did go to [place] but had symptoms beforehand. Saw GI and EGD was unremarkable and labs were normal. 2024 food panel bloodwork was positive to peanuts, wheat, corn and sesame seed; borderline to soy, milk, shrimp, walnuts. Only avoiding corn as it makes his stool different. No other symptoms.  Discussed with patient that skin prick testing and bloodwork (food IgE levels) check for IgE mediated reactions which his clinical  presentation does not support.  His bloodwork may be false positive as Stuart Hardin tolerates all those foods with no immediate reactions that Stuart Hardin can recall.  Good news is Stuart Hardin seems to be doing better and had negative endoscopy and normal bloodwork.  Keep a food journal with symptoms and foods eaten. Monitor your symptoms closely after eating peanuts, wheat, corn and sesame seed as those numbers were higher on the bloodwork.  If you notice any specific foods giving you issues then we can skin test to them in the future.  For mild symptoms you can take over the counter antihistamines such as Benadryl and monitor symptoms closely. If symptoms worsen or if you have severe symptoms including breathing issues, throat closure, significant swelling, whole body hives, severe diarrhea and vomiting, lightheadedness then seek immediate medical care.  Other allergic rhinitis Mild symptoms and takes Claritin prn with good benefit. Use over the counter antihistamines such as Zyrtec (cetirizine), Claritin (loratadine), Allegra (fexofenadine), or Xyzal (levocetirizine) daily as needed. May take twice a day during allergy flares. May switch antihistamines every few months. Monitor symptoms.  Return if symptoms worsen or fail to improve.  No orders of the defined types were placed in this encounter.  Lab Orders  No laboratory test(s) ordered today    Other allergy screening: Asthma: no Rhino conjunctivitis:  some symptoms during seasonal changes and takes Claritin prn with good benefit.  Medication allergy: no Hymenoptera allergy: no Urticaria: no Eczema:no History of recurrent infections suggestive of immunodeficency: no  Diagnostics: None.   Past Medical History: Patient Active Problem List   Diagnosis Date Noted   Dietary counseling and surveillance 09/08/2022   Other allergic rhinitis 09/08/2022   Gastrointestinal complaints 09/08/2022   Infraspinatus tendon tear, right, initial encounter 06/17/2021    Impingement syndrome of right shoulder 04/05/2021   History reviewed. No pertinent past medical history. Past Surgical History: History reviewed. No pertinent surgical history. Medication List:  Current Outpatient Medications  Medication Sig Dispense Refill   esomeprazole (NEXIUM) 20 MG capsule Take 1 capsule (20 mg total) by mouth daily before breakfast. 90 capsule 1   famotidine (PEPCID) 20 MG tablet Take 1 tablet (20 mg total) by mouth 2 (two) times daily. 30 tablet 0   ondansetron (ZOFRAN-ODT) 8 MG disintegrating tablet Take 1 tablet (8 mg total) by mouth every 8 (eight) hours as needed for nausea or vomiting. 20 tablet 0   No current facility-administered medications for this visit.   Allergies: No Known Allergies Social History: Social History   Socioeconomic History   Marital status: Single    Spouse name: Not on file   Number of children: 0   Years of education: Not on file   Highest education level: Not on file  Occupational History   Occupation: server  Tobacco Use   Smoking status: Never   Smokeless tobacco: Never  Vaping Use   Vaping Use: Every day  Substance and Sexual Activity   Alcohol use: Not Currently    Comment: occasionally  Drug use: Not Currently    Types: Marijuana   Sexual activity: Not Currently  Other Topics Concern   Not on file  Social History Narrative   Not on file   Social Determinants of Health   Financial Resource Strain: Not on file  Food Insecurity: Not on file  Transportation Needs: Not on file  Physical Activity: Not on file  Stress: Not on file  Social Connections: Not on file   Lives in a 21 year old house. Smoking: quit vaping in 2024 Occupation: server  Environmental History: Water Damage/mildew in the house: no Charity fundraiser in the family room: no Carpet in the bedroom: yes Heating: gas Cooling: central Pet: yes 2 dogs  Family History: Family History  Problem Relation Age of Onset   Dementia Paternal Grandmother     Colon cancer Neg Hx    Esophageal cancer Neg Hx    Rectal cancer Neg Hx    Stomach cancer Neg Hx    Problem                               Relation Asthma                                   Half-brother  Eczema                                no Food allergy                          no Allergic rhino conjunctivitis     no  Review of Systems  Constitutional:  Positive for appetite change (improving) and unexpected weight change (improving). Negative for chills and fever.  HENT:  Negative for congestion and rhinorrhea.   Eyes:  Negative for itching.  Respiratory:  Negative for cough, chest tightness, shortness of breath and wheezing.   Cardiovascular:  Negative for chest pain.  Gastrointestinal:  Negative for abdominal pain, constipation, diarrhea, nausea and vomiting.  Genitourinary:  Negative for difficulty urinating.  Skin:  Negative for rash.  Neurological:  Negative for headaches.    Objective: BP 118/80   Pulse (!) 50   Temp 98 F (36.7 C)   Resp 18   Ht 6' (1.829 m)   Wt 163 lb 12.8 oz (74.3 kg)   SpO2 99%   BMI 22.22 kg/m  Body mass index is 22.22 kg/m. Physical Exam Vitals and nursing note reviewed.  Constitutional:      Appearance: Normal appearance. Stuart Hardin is well-developed.  HENT:     Head: Normocephalic and atraumatic.     Right Ear: Tympanic membrane and external ear normal.     Left Ear: Tympanic membrane and external ear normal.     Nose: Nose normal.     Mouth/Throat:     Mouth: Mucous membranes are moist.     Pharynx: Oropharynx is clear.  Eyes:     Conjunctiva/sclera: Conjunctivae normal.  Cardiovascular:     Rate and Rhythm: Regular rhythm. Bradycardia present.     Heart sounds: Normal heart sounds. No murmur heard.    No friction rub. No gallop.     Comments: Baseline bradycardia Pulmonary:     Effort: Pulmonary effort is normal.     Breath sounds: Normal breath sounds. No  wheezing, rhonchi or rales.  Musculoskeletal:     Cervical back: Neck  supple.  Skin:    General: Skin is warm.     Findings: No rash.  Neurological:     Mental Status: Stuart Hardin is alert and oriented to person, place, and time.  Psychiatric:        Behavior: Behavior normal.   The plan was reviewed with the patient/family, and all questions/concerned were addressed.  It was my pleasure to see Zeyden today and participate in his care. Please feel free to contact me with any questions or concerns.  Sincerely,  Rexene Alberts, DO Allergy & Immunology  [facility] office: [place] office: [phone]

## 2022-12-05 ENCOUNTER — Ambulatory Visit
Admission: EM | Admit: 2022-12-05 | Discharge: 2022-12-05 | Disposition: A | Discharge status: Home / Self Care | Payer: 59 | Attending: Nurse Practitioner | Admitting: Nurse Practitioner

## 2022-12-05 DIAGNOSIS — R519 Headache, unspecified: Secondary | ICD-10-CM

## 2022-12-05 MED ORDER — KETOROLAC TROMETHAMINE 30 MG/ML IJ SOLN
30.0000 mg | Freq: Once | INTRAMUSCULAR | Status: AC
  Administered 2022-12-05: 30 mg via INTRAMUSCULAR

## 2022-12-05 NOTE — Discharge Instructions (Signed)
You were given a Toradol injection in clinic today. Do not take any over the counter NSAID's such as Advil, ibuprofen, Aleve, or naproxen for 24 hours.  You may take tylenol if needed Please follow-up with your PCP if your symptoms do not improve Please go to the ER for any worsening symptoms

## 2022-12-05 NOTE — ED Provider Notes (Signed)
[facility]    CSN: [id number] Arrival date & time: 12/05/22  1827      History   Chief Complaint Chief Complaint  Patient presents with   Migraine    HPI Keeven Skeeters is a 22 y.o. male presents for evaluation of a headache.  Patient reports 3 days ago he developed a posterior headache that has been intermittent since that time.  He states over the past couple days it seems to be worsening but continues to be intermittent.  It is associated with photosensitivity and some spots in his vision that have since resolved.  Denies nausea/vomiting, dizziness, unilateral weakness, syncope, neck pain.  He rates his headache as a 7 out of 10 and denies it is the worst headache of his life.  Denies history of migraines.  Denies thunderclap headache.  No first-degree relative with history of SAH.  He did take Excedrin today that temporarily helped his headache.  He has not taken any OTC ibuprofen today.  No other concerns at this time.   Migraine Associated symptoms include headaches.    History reviewed. No pertinent past medical history.  Patient Active Problem List   Diagnosis Date Noted   Dietary counseling and surveillance 09/08/2022   Other allergic rhinitis 09/08/2022   Gastrointestinal complaints 09/08/2022   Infraspinatus tendon tear, right, initial encounter 06/17/2021   Impingement syndrome of right shoulder 04/05/2021    History reviewed. No pertinent surgical history.     Home Medications    Prior to Admission medications   Medication Sig Start Date End Date Taking? Authorizing Provider  esomeprazole (NEXIUM) 20 MG capsule Take 1 capsule (20 mg total) by mouth daily before breakfast. 03/31/22   Wallis Bamberg, PA-C  famotidine (PEPCID) 20 MG tablet Take 1 tablet (20 mg total) by mouth 2 (two) times daily. 03/31/22   Wallis Bamberg, PA-C  ondansetron (ZOFRAN-ODT) 8 MG disintegrating tablet Take 1 tablet (8 mg total) by mouth every 8 (eight) hours as needed for nausea or  vomiting. 03/31/22   Wallis Bamberg, PA-C    Family History Family History  Problem Relation Age of Onset   Dementia Paternal Grandmother    Colon cancer Neg Hx    Esophageal cancer Neg Hx    Rectal cancer Neg Hx    Stomach cancer Neg Hx     Social History Social History   Tobacco Use   Smoking status: Never   Smokeless tobacco: Never  Vaping Use   Vaping Use: Every day  Substance Use Topics   Alcohol use: Not Currently    Comment: occasionally   Drug use: Not Currently    Types: Marijuana     Allergies   Patient has no known allergies.   Review of Systems Review of Systems  Eyes:  Positive for photophobia and visual disturbance.  Neurological:  Positive for headaches.     Physical Exam Triage Vital Signs ED Triage Vitals  Enc Vitals Group     BP 12/05/22 1836 138/84     Pulse Rate 12/05/22 1836 96     Resp 12/05/22 1836 16     Temp 12/05/22 1836 99.7 F (37.6 C)     Temp Source 12/05/22 1836 Oral     SpO2 12/05/22 1836 97 %     Weight --      Height --      Head Circumference --      Peak Flow --      Pain Score 12/05/22 1839 10  Pain Loc --      Pain Edu? --      Excl. in GC? --    No data found.  Updated Vital Signs BP 138/84 (BP Location: Right Arm)   Pulse 96   Temp 99.7 F (37.6 C) (Oral)   Resp 16   SpO2 97%   Visual Acuity Right Eye Distance:   Left Eye Distance:   Bilateral Distance:    Right Eye Near:   Left Eye Near:    Bilateral Near:     Physical Exam Vitals and nursing note reviewed.  Constitutional:      General: He is not in acute distress.    Appearance: Normal appearance. He is not ill-appearing.  HENT:     Head: Normocephalic and atraumatic.  Eyes:     Extraocular Movements: Extraocular movements intact.     Conjunctiva/sclera: Conjunctivae normal.     Pupils: Pupils are equal, round, and reactive to light.  Neck:     Meningeal: Brudzinski's sign and Kernig's sign absent.  Cardiovascular:     Rate and  Rhythm: Normal rate.  Pulmonary:     Effort: Pulmonary effort is normal.  Musculoskeletal:     Cervical back: Normal range of motion and neck supple. No rigidity.  Skin:    General: Skin is warm and dry.  Neurological:     General: No focal deficit present.     Mental Status: He is alert and oriented to person, place, and time.     GCS: GCS eye subscore is 4. GCS verbal subscore is 5. GCS motor subscore is 6.     Cranial Nerves: No facial asymmetry.     Motor: No weakness or pronator drift.     Coordination: Romberg sign negative. Finger-Nose-Finger Test normal.  Psychiatric:        Mood and Affect: Mood normal.        Behavior: Behavior normal.      UC Treatments / Results  Labs (all labs ordered are listed, but only abnormal results are displayed) Labs Reviewed - No data to display  EKG   Radiology No results found.  Procedures Procedures (including critical care time)  Medications Ordered in UC Medications  ketorolac (TORADOL) 30 MG/ML injection 30 mg (30 mg Intramuscular Given 12/05/22 1848)    Initial Impression / Assessment and Plan / UC Course  I have reviewed the triage vital signs and the nursing notes.  Pertinent labs & imaging results that were available during my care of the patient were reviewed by me and considered in my medical decision making (see chart for details).    Reviewed exam and symptoms with patient.  Negative neurological exam.  While patient does have low-grade fever in clinic, is not over 100.4, he has no nuchal rigidity, no mental status changes, and negative Kernig and Brudzinski Patient given Toradol injection in clinic.  He was monitored for 20 minutes after injection with no reaction noted and tolerated well.  Reports improvement in headache and now currently rates his headache as a 4 out of 10 He was instructed no NSAIDs for 24 hours and he verbalized understanding Patient to follow-up with PCP if symptoms do not improve Strict ER  precautions reviewed and patient verbalized understanding Final Clinical Impressions(s) / UC Diagnoses   Final diagnoses:  Acute nonintractable headache, unspecified headache type     Discharge Instructions      You were given a Toradol injection in clinic today. Do not take any over the  counter NSAID's such as Advil, ibuprofen, Aleve, or naproxen for 24 hours.  You may take tylenol if needed Please follow-up with your PCP if your symptoms do not improve Please go to the ER for any worsening symptoms     ED Prescriptions   None    PDMP not reviewed this encounter.   Radford Pax, NP 12/05/22 [phone]

## 2022-12-05 NOTE — ED Triage Notes (Signed)
Pt presents to UC w/ c/o headache/migraine x3 days. Spots in vision for past 2 hours. Took excedrin today helped for 2 hours and pain came back.

## 2023-11-13 IMAGING — MR MR SHOULDER*R* W/CM
6 series · 40 of 40 positions shown · IV contrast (agent unspecified)
Comparison: None.

CLINICAL DATA: Right shoulder pain for 9 years

EXAM:
MR ARTHROGRAM OF THE RIGHT SHOULDER
TECHNIQUE: Multiplanar, multisequence MR imaging of the right shoulder was
performed following the administration of intra-articular contrast.
CONTRAST:  See Injection Documentation.

[Series 3: T1 fat-sat · axial · 4.0mm · 0.27mm/px · z∈[-53,+56]mm · 7 of 23 slices shown (1 of 4)]
[im 1/23]
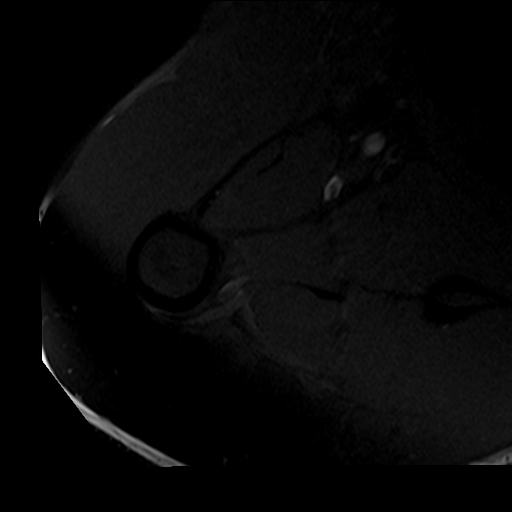
[im 4/23]
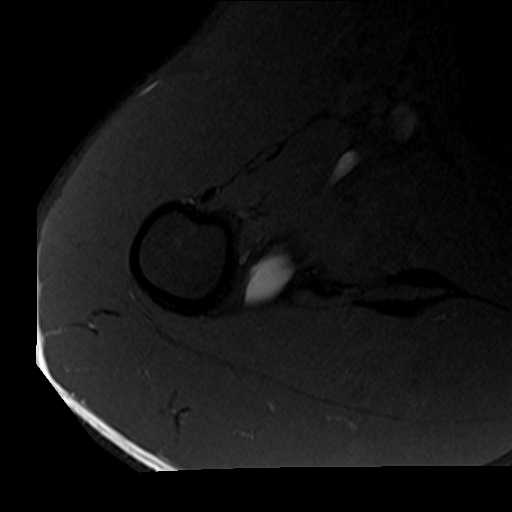
[im 8/23]
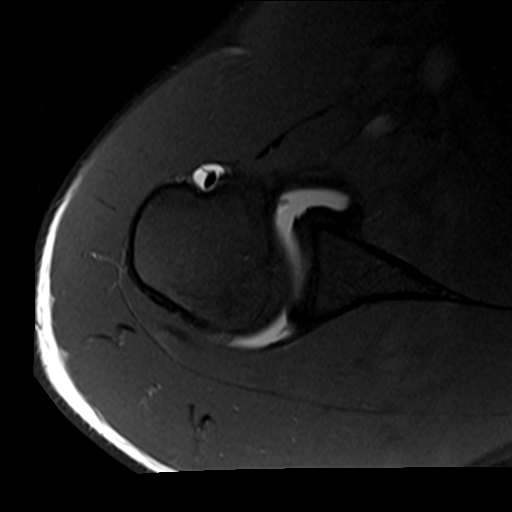
[im 12/23]
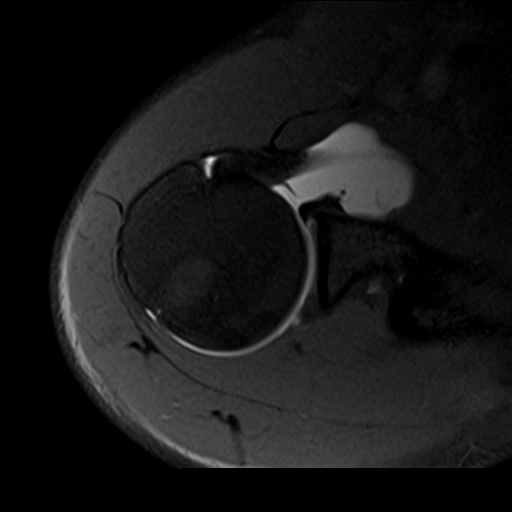
[im 15/23]
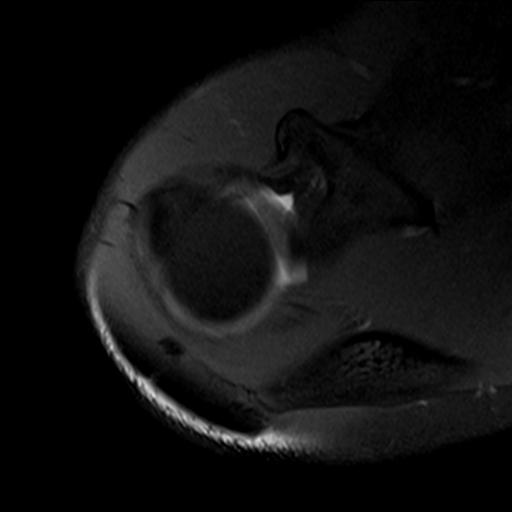
[im 19/23]
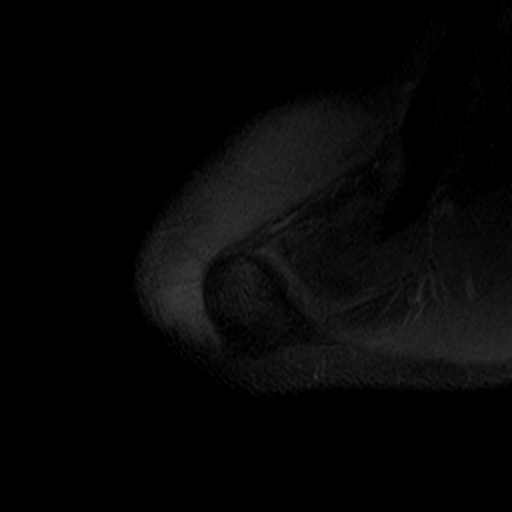
[im 23/23]
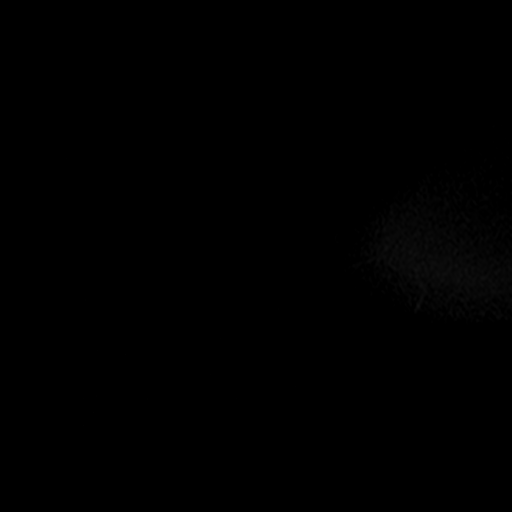

[Series 4: T2 fat-sat · oblique · 4.0mm · 0.59mm/px · 7 of 22 slices shown (1 of 2)]
[im 1/22]
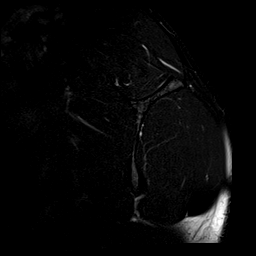
[im 4/22]
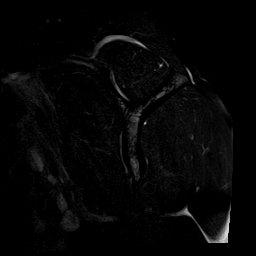
[im 8/22]
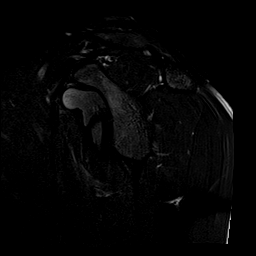
[im 11/22]
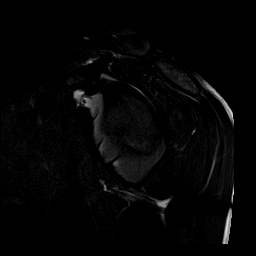
[im 15/22]
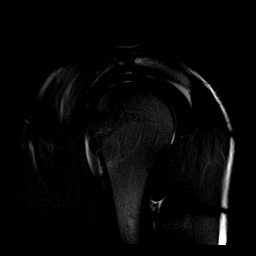
[im 18/22]
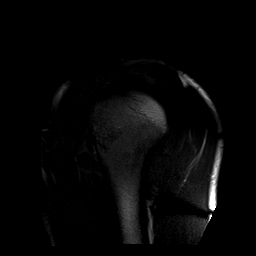
[im 22/22]
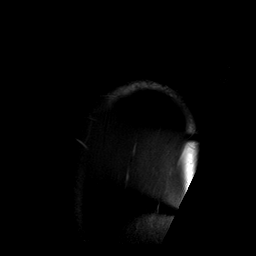

[Series 5: T1 fat-sat · oblique · 4.0mm · 0.59mm/px · 7 of 20 slices shown (2 of 4)]
[im 1/20]
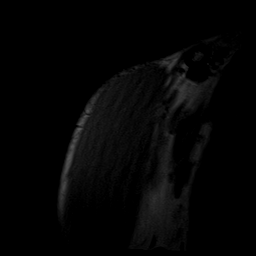
[im 4/20]
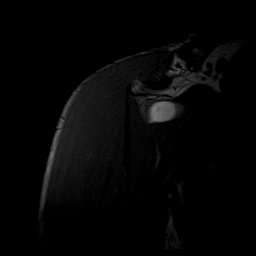
[im 7/20]
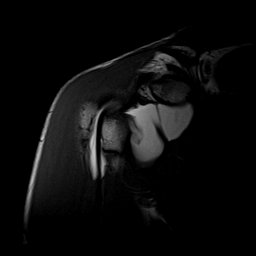
[im 10/20]
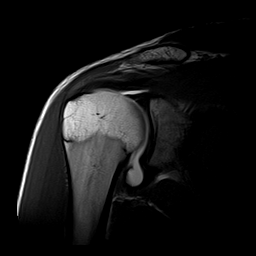
[im 13/20]
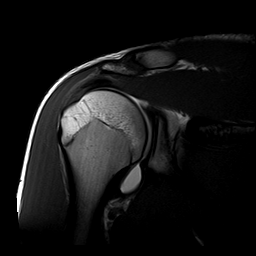
[im 16/20]
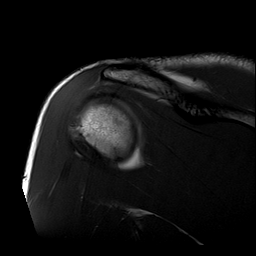
[im 20/20]
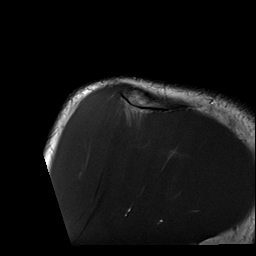

[Series 6: T1 fat-sat · oblique · 4.0mm · 0.59mm/px · 7 of 20 slices shown (3 of 4)]
[im 1/20]
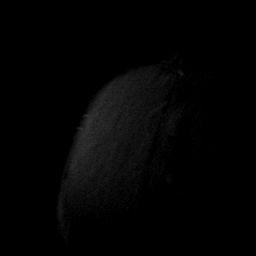
[im 4/20]
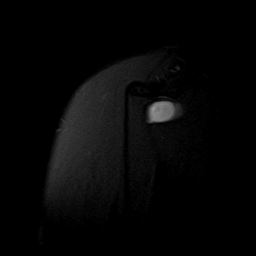
[im 7/20]
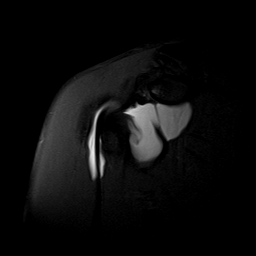
[im 10/20]
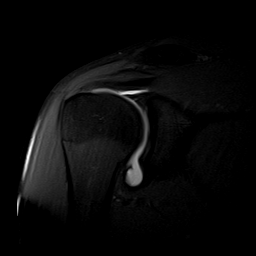
[im 13/20]
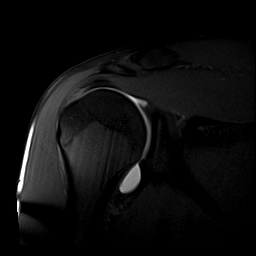
[im 16/20]
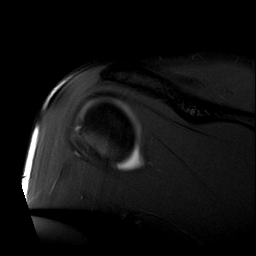
[im 20/20]
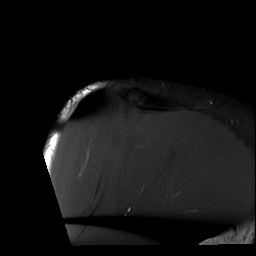

[Series 7: T2 fat-sat · oblique · 4.0mm · 0.59mm/px · 6 of 19 slices shown (2 of 2)]
[im 1/19]
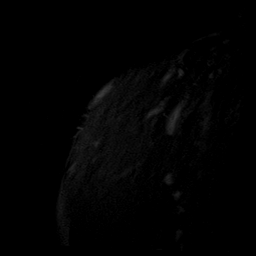
[im 4/19]
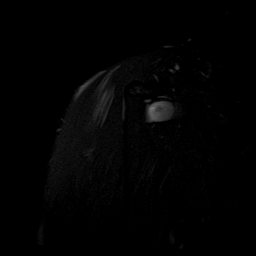
[im 8/19]
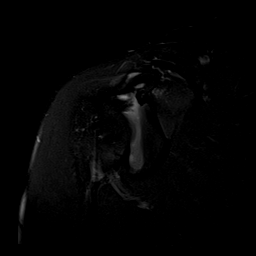
[im 11/19]
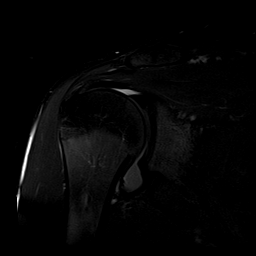
[im 15/19]
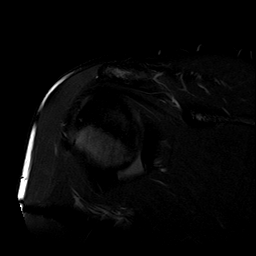
[im 19/19]
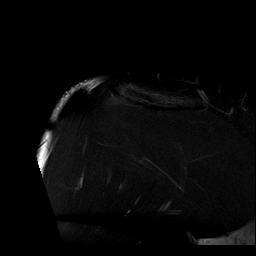

[Series 10: T1 fat-sat · sagittal · 4.0mm · 0.59mm/px · 6 of 18 slices shown (4 of 4)]
[im 1/18]
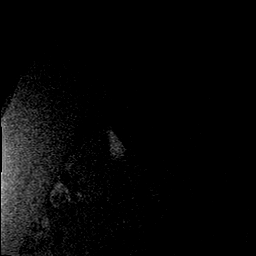
[im 4/18]
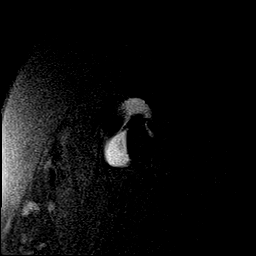
[im 7/18]
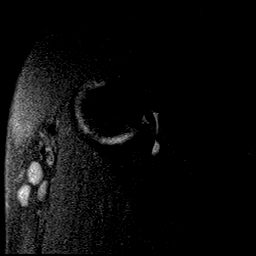
[im 11/18]
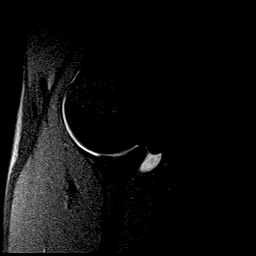
[im 14/18]
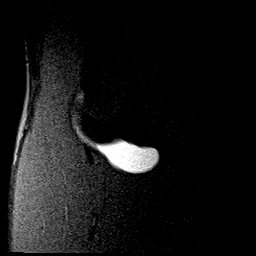
[im 18/18]
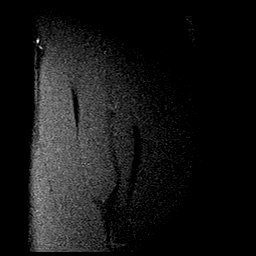

[40 of 40 positions shown; findings below may reference images not displayed]

FINDINGS: Rotator cuff: Supraspinatus tendon is intact. Mild tendinosis of the
infraspinatus tendon with a small interstitial tear. Teres minor
tendon is intact. Subscapularis tendon is intact.

Muscles: No muscle atrophy or edema. No intramuscular fluid
collection or hematoma.

Biceps Long Head: Intraarticular and extraarticular portions of the
biceps tendon are intact.

Acromioclavicular Joint: No significant arthropathy of the
acromioclavicular joint. No subacromial/subdeltoid bursal fluid.

Glenohumeral Joint: Intraarticular contrast distending the joint
capsule. Normal glenohumeral ligaments. No chondral defect.

Labrum: No labral tear. Tiny tear at the posterior labroligamentous
junction.

Bones: No fracture or dislocation. No aggressive osseous lesion.

Other: No fluid collection or hematoma.
IMPRESSION: 1. Mild tendinosis of the infraspinatus tendon with a small
interstitial tear.
2. No discrete labral tear. Tiny tear at the posterior
labroligamentous junction.

## 2023-11-13 IMAGING — XA DG FLUORO GUIDE NDL PLC/BX
1 series · 1 of 1 positions shown · IV contrast (multihance)
Comparison: none

CLINICAL DATA: Right shoulder pain.

EXAM:
RIGHT SHOULDER INJECTION UNDER FLUOROSCOPY
TECHNIQUE: An appropriate skin entrance site was determined. The site was
marked, prepped with Betadine, draped in the usual sterile fashion,
and infiltrated locally with 1% lidocaine. A 22 gauge spinal needle
was advanced to the superomedial margin of the humeral head under
intermittent fluoroscopy. 1 mL of 1% lidocaine injected easily. A
mixture of 0.1 mL of MultiHance, 15 mL of Isovue-M 200, and 5 mL of
sterile saline was then used to opacify the right shoulder capsule.
12 mL of this mixture were injected. No immediate complication.
FLUOROSCOPY TIME:  Fluoroscopy Time:  6 seconds
Radiation Exposure Index (if provided by the fluoroscopic device):
0.20 mGy
Number of Acquired Spot Images: 0

[Series 2: ortho adipose · 1 of 1 slices shown]
[im 1/1]
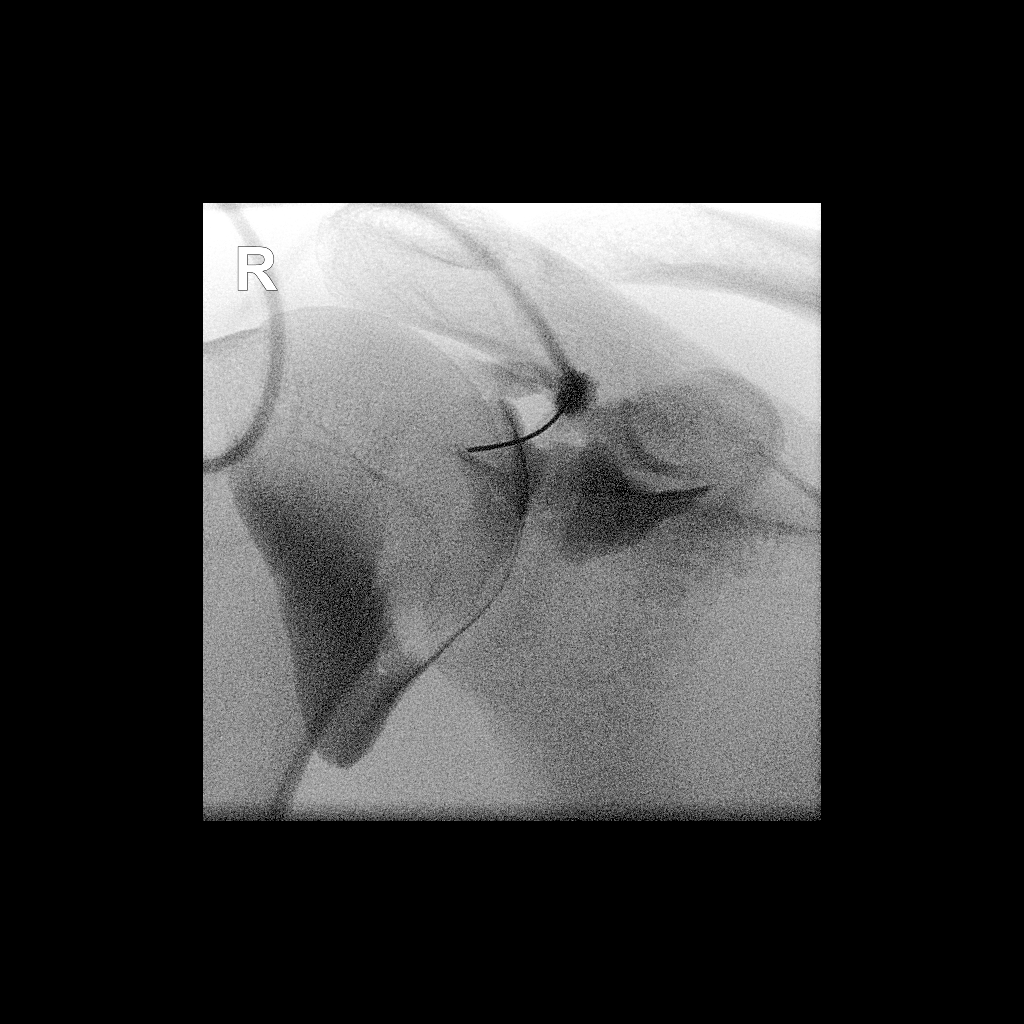

[1 of 1 positions shown; findings below may reference images not displayed]

IMPRESSION: Technically successful right shoulder injection for MRI.
